# Patient Record
Sex: Female | Born: 1990 | Race: Black or African American | Hispanic: No | Marital: Single | Smoking: Never smoker
Health system: Southern US, Community
[De-identification: ages and names within clinical notes are randomized; demographics above are authoritative.]

## PROBLEM LIST (undated history)

## (undated) ENCOUNTER — Inpatient Hospital Stay (HOSPITAL_COMMUNITY): Payer: BC Managed Care – PPO

## (undated) ENCOUNTER — Inpatient Hospital Stay (HOSPITAL_COMMUNITY): Payer: Self-pay

## (undated) DIAGNOSIS — D219 Benign neoplasm of connective and other soft tissue, unspecified: Secondary | ICD-10-CM

## (undated) DIAGNOSIS — D649 Anemia, unspecified: Secondary | ICD-10-CM

## (undated) DIAGNOSIS — R87629 Unspecified abnormal cytological findings in specimens from vagina: Secondary | ICD-10-CM

## (undated) HISTORY — DX: Anemia, unspecified: D64.9

## (undated) HISTORY — DX: Unspecified abnormal cytological findings in specimens from vagina: R87.629

## (undated) HISTORY — DX: Benign neoplasm of connective and other soft tissue, unspecified: D21.9

---

## 2011-03-30 ENCOUNTER — Emergency Department (HOSPITAL_COMMUNITY)
Admission: EM | Admit: 2011-03-30 | Discharge: 2011-03-31 | Disposition: A | Discharge status: Home / Self Care | Payer: Self-pay | Attending: Emergency Medicine | Admitting: Emergency Medicine

## 2011-03-30 DIAGNOSIS — R21 Rash and other nonspecific skin eruption: Secondary | ICD-10-CM | POA: Insufficient documentation

## 2011-03-30 DIAGNOSIS — L509 Urticaria, unspecified: Secondary | ICD-10-CM | POA: Insufficient documentation

## 2013-12-25 ENCOUNTER — Ambulatory Visit: Payer: Self-pay

## 2013-12-31 ENCOUNTER — Ambulatory Visit: Payer: Self-pay

## 2014-01-01 ENCOUNTER — Ambulatory Visit: Payer: Self-pay

## 2014-01-19 ENCOUNTER — Emergency Department (INDEPENDENT_AMBULATORY_CARE_PROVIDER_SITE_OTHER)
Admission: EM | Admit: 2014-01-19 | Discharge: 2014-01-19 | Disposition: A | Discharge status: Home / Self Care | Payer: 59 | Source: Home / Self Care | Attending: Family Medicine | Admitting: Family Medicine

## 2014-01-19 DIAGNOSIS — M549 Dorsalgia, unspecified: Secondary | ICD-10-CM

## 2014-01-19 DIAGNOSIS — M79609 Pain in unspecified limb: Secondary | ICD-10-CM

## 2014-01-19 DIAGNOSIS — Z041 Encounter for examination and observation following transport accident: Secondary | ICD-10-CM
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
## 2014-01-19 NOTE — ED Notes (Signed)
Waiting discharge papers 
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
## 2014-01-19 NOTE — Discharge Instructions (Signed)
Tylenol or advil and heat as needed, return as needed.

## 2015-09-21 ENCOUNTER — Encounter (HOSPITAL_COMMUNITY): Payer: Self-pay | Admitting: Emergency Medicine

## 2015-09-21 ENCOUNTER — Emergency Department (HOSPITAL_COMMUNITY)
Admission: EM | Admit: 2015-09-21 | Discharge: 2015-09-22 | Disposition: A | Discharge status: Home / Self Care | Payer: PRIVATE HEALTH INSURANCE | Attending: Emergency Medicine | Admitting: Emergency Medicine

## 2015-09-21 DIAGNOSIS — Z880 Allergy status to penicillin: Secondary | ICD-10-CM | POA: Diagnosis not present

## 2015-09-21 DIAGNOSIS — Y9389 Activity, other specified: Secondary | ICD-10-CM | POA: Insufficient documentation

## 2015-09-21 DIAGNOSIS — Y998 Other external cause status: Secondary | ICD-10-CM | POA: Insufficient documentation

## 2015-09-21 DIAGNOSIS — Y92410 Unspecified street and highway as the place of occurrence of the external cause: Secondary | ICD-10-CM | POA: Diagnosis not present

## 2015-09-21 DIAGNOSIS — S39012A Strain of muscle, fascia and tendon of lower back, initial encounter: Secondary | ICD-10-CM

## 2015-09-21 DIAGNOSIS — S3992XA Unspecified injury of lower back, initial encounter: Secondary | ICD-10-CM | POA: Diagnosis present

## 2015-09-21 MED ORDER — METHOCARBAMOL 500 MG PO TABS
500.0000 mg | ORAL_TABLET | Freq: Once | ORAL | Status: AC
  Administered 2015-09-21: 500 mg via ORAL
  Filled 2015-09-21: qty 1

## 2015-09-21 MED ORDER — METHOCARBAMOL 500 MG PO TABS: 500.0000 mg | ORAL_TABLET | Freq: Two times a day (BID) | ORAL | Status: DC

## 2015-09-21 MED ORDER — IBUPROFEN 400 MG PO TABS
400.0000 mg | ORAL_TABLET | Freq: Once | ORAL | Status: AC
  Administered 2015-09-21: 400 mg via ORAL
  Filled 2015-09-21: qty 1
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
## 2015-09-21 NOTE — Discharge Instructions (Signed)
For pain control you may take up to 800mg  of Motrin (also known as ibuprofen). That is usually 4 over the counter pills,  3 times a day. Take with food to minimize stomach irritation   For breakthrough pain you may take Robaxin. Do not drink alcohol, drive or operate heavy machinery when taking Robaxin.  Please follow with your primary care doctor in the next 2 days for a check-up. They must obtain records for further management.   Do not hesitate to return to the Emergency Department for any new, worsening or concerning symptoms.   Lumbosacral Strain Lumbosacral strain is a strain of any of the parts that make up your lumbosacral vertebrae. Your lumbosacral vertebrae are the bones that make up the lower third of your backbone. Your lumbosacral vertebrae are held together by muscles and tough, fibrous tissue (ligaments).  CAUSES  A sudden blow to your back can cause lumbosacral strain. Also, anything that causes an excessive stretch of the muscles in the low back can cause this strain. This is typically seen when people exert themselves strenuously, fall, lift heavy objects, bend, or crouch repeatedly. RISK FACTORS  Physically demanding work.  Participation in pushing or pulling sports or sports that require a sudden twist of the back (tennis, golf, baseball).  Weight lifting.  Excessive lower back curvature.  Forward-tilted pelvis.  Weak back or abdominal muscles or both.  Tight hamstrings. SIGNS AND SYMPTOMS  Lumbosacral strain may cause pain in the area of your injury or pain that moves (radiates) down your leg.  DIAGNOSIS Your health care provider can often diagnose lumbosacral strain through a physical exam. In some cases, you may need tests such as X-ray exams.  TREATMENT  Treatment for your lower back injury depends on many factors that your clinician will have to evaluate. However, most treatment will include the use of anti-inflammatory medicines. HOME CARE INSTRUCTIONS    Avoid hard physical activities (tennis, racquetball, waterskiing) if you are not in proper physical condition for it. This may aggravate or create problems.  If you have a back problem, avoid sports requiring sudden body movements. Swimming and walking are generally safer activities.  Maintain good posture.  Maintain a healthy weight.  For acute conditions, you may put ice on the injured area.  Put ice in a plastic bag.  Place a towel between your skin and the bag.  Leave the ice on for 20 minutes, 2-3 times a day.  When the low back starts healing, stretching and strengthening exercises may be recommended. SEEK MEDICAL CARE IF:  Your back pain is getting worse.  You experience severe back pain not relieved with medicines. SEEK IMMEDIATE MEDICAL CARE IF:   You have numbness, tingling, weakness, or problems with the use of your arms or legs.  There is a change in bowel or bladder control.  You have increasing pain in any area of the body, including your belly (abdomen).  You notice shortness of breath, dizziness, or feel faint.  You feel sick to your stomach (nauseous), are throwing up (vomiting), or become sweaty.  You notice discoloration of your toes or legs, or your feet get very cold. MAKE SURE YOU:   Understand these instructions.  Will watch your condition.  Will get help right away if you are not doing well or get worse.   This information is not intended to replace advice given to you by your health care provider. Make sure you discuss any questions you have with your health care provider.  Document Released: 09/13/2005 Document Revised: 12/25/2014 Document Reviewed: 07/23/2013 Elsevier Interactive Patient Education [employer].

## 2015-09-22 NOTE — ED Notes (Signed)
Pt waiting for registration to obtain insurance info and consent for treatment; will DC patient afterward; pt verbalizes need for wait

## 2015-10-27 ENCOUNTER — Other Ambulatory Visit: Payer: Self-pay | Admitting: Physician Assistant

## 2015-10-27 ENCOUNTER — Ambulatory Visit
Admission: RE | Admit: 2015-10-27 | Discharge: 2015-10-27 | Disposition: A | Discharge status: Home / Self Care | Payer: PRIVATE HEALTH INSURANCE | Source: Ambulatory Visit | Attending: Physician Assistant | Admitting: Physician Assistant

## 2015-10-29 ENCOUNTER — Ambulatory Visit
Admission: RE | Admit: 2015-10-29 | Discharge: 2015-10-29 | Disposition: A | Discharge status: Home / Self Care | Payer: PRIVATE HEALTH INSURANCE | Source: Ambulatory Visit | Attending: Physician Assistant | Admitting: Physician Assistant
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
## 2020-06-16 ENCOUNTER — Encounter: Payer: Self-pay | Admitting: General Practice

## 2020-06-16 ENCOUNTER — Ambulatory Visit (INDEPENDENT_AMBULATORY_CARE_PROVIDER_SITE_OTHER): Payer: BC Managed Care – PPO | Admitting: General Practice

## 2020-06-16 ENCOUNTER — Other Ambulatory Visit: Payer: Self-pay

## 2020-06-16 DIAGNOSIS — Z3201 Encounter for pregnancy test, result positive: Secondary | ICD-10-CM | POA: Diagnosis not present

## 2020-06-16 LAB — POCT PREGNANCY, URINE: Preg Test, Ur: POSITIVE — AB

## 2020-06-16 NOTE — Progress Notes (Signed)
Patient arrived to office and gave urine sample for UPT then left. UPT +. Attempted to call patient twice, no answer. Not able to leave voicemail as it was not set up. Will send letter.   Koren Bound RN BSN 06/16/20

## 2020-06-18 ENCOUNTER — Telehealth (INDEPENDENT_AMBULATORY_CARE_PROVIDER_SITE_OTHER): Payer: PRIVATE HEALTH INSURANCE | Admitting: Lactation Services

## 2020-06-18 DIAGNOSIS — Z3201 Encounter for pregnancy test, result positive: Secondary | ICD-10-CM

## 2020-06-18 NOTE — Telephone Encounter (Signed)
Patient called and was upset that she was not called with her pregnancy test results that were taken earlier in the week. Patient was upset that she had not been called.   Nurse did attempt to call patient x 2 and sent a letter.   Called patient and she reports her phone number was incorrect in the chart and that she has spoken with the front desk this morning and was given her results and told when she can get an appt for [facility]. Patient voiced she has no further questions or concerns at this time.

## 2020-06-18 NOTE — Progress Notes (Signed)
Patient was assessed and managed by nursing staff during this encounter. I have reviewed the chart and agree with the documentation and plan.  Maryann Conners, CNM 06/18/2020 8:17 PM

## 2020-06-27 ENCOUNTER — Other Ambulatory Visit: Payer: Self-pay

## 2020-06-27 ENCOUNTER — Ambulatory Visit (HOSPITAL_COMMUNITY)
Admission: EM | Admit: 2020-06-27 | Discharge: 2020-06-27 | Disposition: A | Discharge status: Home / Self Care | Payer: PRIVATE HEALTH INSURANCE

## 2020-06-27 ENCOUNTER — Encounter (HOSPITAL_COMMUNITY): Payer: Self-pay | Admitting: Obstetrics and Gynecology

## 2020-06-27 ENCOUNTER — Inpatient Hospital Stay (HOSPITAL_COMMUNITY): Payer: BC Managed Care – PPO

## 2020-06-27 ENCOUNTER — Inpatient Hospital Stay (HOSPITAL_COMMUNITY)
Admission: AD | Admit: 2020-06-27 | Discharge: 2020-06-27 | Disposition: A | Discharge status: Home / Self Care | Payer: BC Managed Care – PPO | Attending: Obstetrics and Gynecology | Admitting: Obstetrics and Gynecology

## 2020-06-27 DIAGNOSIS — D259 Leiomyoma of uterus, unspecified: Secondary | ICD-10-CM | POA: Diagnosis not present

## 2020-06-27 DIAGNOSIS — O468X1 Other antepartum hemorrhage, first trimester: Secondary | ICD-10-CM

## 2020-06-27 DIAGNOSIS — Z880 Allergy status to penicillin: Secondary | ICD-10-CM | POA: Diagnosis not present

## 2020-06-27 DIAGNOSIS — O209 Hemorrhage in early pregnancy, unspecified: Secondary | ICD-10-CM

## 2020-06-27 DIAGNOSIS — A5901 Trichomonal vulvovaginitis: Secondary | ICD-10-CM | POA: Diagnosis not present

## 2020-06-27 DIAGNOSIS — O3680X0 Pregnancy with inconclusive fetal viability, not applicable or unspecified: Secondary | ICD-10-CM

## 2020-06-27 DIAGNOSIS — O208 Other hemorrhage in early pregnancy: Secondary | ICD-10-CM | POA: Insufficient documentation

## 2020-06-27 DIAGNOSIS — O3411 Maternal care for benign tumor of corpus uteri, first trimester: Secondary | ICD-10-CM | POA: Insufficient documentation

## 2020-06-27 DIAGNOSIS — O98311 Other infections with a predominantly sexual mode of transmission complicating pregnancy, first trimester: Secondary | ICD-10-CM | POA: Diagnosis not present

## 2020-06-27 DIAGNOSIS — Z3A01 Less than 8 weeks gestation of pregnancy: Secondary | ICD-10-CM | POA: Diagnosis not present

## 2020-06-27 DIAGNOSIS — O418X10 Other specified disorders of amniotic fluid and membranes, first trimester, not applicable or unspecified: Secondary | ICD-10-CM

## 2020-06-27 DIAGNOSIS — O26891 Other specified pregnancy related conditions, first trimester: Secondary | ICD-10-CM

## 2020-06-27 LAB — URINALYSIS, ROUTINE W REFLEX MICROSCOPIC
Bacteria, UA: NONE SEEN
Bilirubin Urine: NEGATIVE
Glucose, UA: NEGATIVE mg/dL
Ketones, ur: NEGATIVE mg/dL
Leukocytes,Ua: NEGATIVE
Nitrite: NEGATIVE
Protein, ur: NEGATIVE mg/dL
Specific Gravity, Urine: 1.026 (ref 1.005–1.030)
pH: 5 (ref 5.0–8.0)

## 2020-06-27 LAB — CBC
HCT: 35.6 % — ABNORMAL LOW (ref 36.0–46.0)
Hemoglobin: 11.3 g/dL — ABNORMAL LOW (ref 12.0–15.0)
MCH: 26.3 pg (ref 26.0–34.0)
MCHC: 31.7 g/dL (ref 30.0–36.0)
MCV: 82.8 fL (ref 80.0–100.0)
Platelets: 338 10*3/uL (ref 150–400)
RBC: 4.3 MIL/uL (ref 3.87–5.11)
RDW: 14.3 % (ref 11.5–15.5)
WBC: 8.8 10*3/uL (ref 4.0–10.5)
nRBC: 0 % (ref 0.0–0.2)

## 2020-06-27 LAB — WET PREP, GENITAL
Sperm: NONE SEEN
Yeast Wet Prep HPF POC: NONE SEEN

## 2020-06-27 LAB — ABO/RH: ABO/RH(D): O POS

## 2020-06-27 LAB — HCG, QUANTITATIVE, PREGNANCY: hCG, Beta Chain, Quant, S: 4515 m[IU]/mL — ABNORMAL HIGH (ref <5)

## 2020-06-27 MED ORDER — METRONIDAZOLE 500 MG PO TABS
2000.0000 mg | ORAL_TABLET | Freq: Once | ORAL | Status: AC
  Administered 2020-06-27: 2000 mg via ORAL
  Filled 2020-06-27: qty 4

## 2020-06-27 NOTE — Discharge Instructions (Signed)
Return to care   If you have heavier bleeding that soaks through more that 2 pads per hour for an hour or more  If you bleed so much that you feel like you might pass out or you do pass out  If you have significant abdominal pain that is not improved with Tylenol     Uterine Fibroids  Uterine fibroids (leiomyomas) are noncancerous (benign) tumors that can develop in the uterus. Fibroids may also develop in the fallopian tubes, cervix, or tissues (ligaments) near the uterus. You may have one or many fibroids. Fibroids vary in size, weight, and where they grow in the uterus. Some can become quite large. Most fibroids do not require medical treatment. What are the causes? The cause of this condition is not known. What increases the risk? You are more likely to develop this condition if you:  Are in your 30s or 40s and have not gone through menopause.  Have a family history of this condition.  Are of African-American descent.  Had your first period at an early age (early menarche).  Have not had any children (nulliparity).  Are overweight or obese. What are the signs or symptoms? Many women do not have any symptoms. Symptoms of this condition may include:  Heavy menstrual bleeding.  Bleeding or spotting between periods.  Pain and pressure in the pelvic area, between the hips.  Bladder problems, such as needing to urinate urgently or more often than usual.  Inability to have children (infertility).  Failure to carry pregnancy to term (miscarriage). How is this diagnosed? This condition may be diagnosed based on:  Your symptoms and medical history.  A physical exam.  A pelvic exam that includes feeling for any tumors.  Imaging tests, such as ultrasound or MRI. How is this treated? Treatment for this condition may include:  Seeing your health care provider for follow-up visits to monitor your fibroids for any changes.  Taking NSAIDs such as ibuprofen, naproxen, or  aspirin to reduce pain.  Hormone medicines. These may be taken as a pill, given in an injection, or delivered by a T-shaped device that is inserted into the uterus (intrauterine device, IUD).  Surgery to remove one of the following: ? The fibroids (myomectomy). Your health care provider may recommend this if fibroids affect your fertility and you want to become pregnant. ? The uterus (hysterectomy). ? Blood supply to the fibroids (uterine artery embolization). Follow these instructions at home:  Take over-the-counter and prescription medicines only as told by your health care provider.  Ask your health care provider if you should take iron pills or eat more iron-rich foods, such as dark green, leafy vegetables. Heavy menstrual bleeding can cause low iron levels.  If directed, apply heat to your back or abdomen to reduce pain. Use the heat source that your health care provider recommends, such as a moist heat pack or a heating pad. ? Place a towel between your skin and the heat source. ? Leave the heat on for 20-30 minutes. ? Remove the heat if your skin turns bright red. This is especially important if you are unable to feel pain, heat, or cold. You may have a greater risk of getting burned.  Pay close attention to your menstrual cycle. Tell your health care provider about any changes, such as: ? Increased blood flow that requires you to use more pads or tampons than usual. ? A change in the number of days that your period lasts. ? A change in symptoms  that are associated with your period, such as back pain or cramps in your abdomen.  Keep all follow-up visits as told by your health care provider. This is important, especially if your fibroids need to be monitored for any changes. Contact a health care provider if you:  Have pelvic pain, back pain, or cramps in your abdomen that do not get better with medicine or heat.  Develop new bleeding between periods.  Have increased bleeding  during or between periods.  Feel unusually tired or weak.  Feel light-headed. Get help right away if you:  Faint.  Have pelvic pain that suddenly gets worse.  Have severe vaginal bleeding that soaks a tampon or pad in 30 minutes or less. Summary  Uterine fibroids are noncancerous (benign) tumors that can develop in the uterus.  The exact cause of this condition is not known.  Most fibroids do not require medical treatment unless they affect your ability to have children (fertility).  Contact a health care provider if you have pelvic pain, back pain, or cramps in your abdomen that do not get better with medicines.  Make sure you know what symptoms should cause you to get help right away. This information is not intended to replace advice given to you by your health care provider. Make sure you discuss any questions you have with your health care provider. Document Revised: 11/16/2017 Document Reviewed: 10/30/2017 Elsevier Patient Education  2020 [employer].  Threatened Miscarriage  A threatened miscarriage occurs when a woman has vaginal bleeding during the first 20 weeks of pregnancy but the pregnancy has not ended. If you have vaginal bleeding during this time, your health care provider will do tests to make sure you are still pregnant. If the tests show that you are still pregnant and that the developing baby (fetus) inside your uterus is still growing, your condition is considered a threatened miscarriage. A threatened miscarriage does not mean your pregnancy will end, but it does increase the risk of losing your pregnancy (complete miscarriage). What are the causes? The cause of this condition is usually not known. For women who go on to have a complete miscarriage, the most common cause is an abnormal number of chromosomes in the developing baby. Chromosomes are the structures inside cells that hold all of a person's genetic material. What increases the risk? The following  lifestyle factors may increase your risk of a miscarriage in early pregnancy:  Smoking.  Drinking excessive amounts of alcohol or caffeine.  Recreational drug use. The following preexisting health conditions may increase your risk of a miscarriage in early pregnancy:  Polycystic ovary syndrome.  Uterine fibroids.  Infections.  Diabetes mellitus. What are the signs or symptoms? Symptoms of this condition include:  Vaginal bleeding.  Mild abdominal pain or cramps. How is this diagnosed? If you have bleeding with or without abdominal pain before 20 weeks of pregnancy, your health care provider will do tests to check whether you are still pregnant. These will include:  Ultrasound. This test uses sound waves to create images of the inside of your uterus. This allows your health care provider to look at your developing baby and other structures, such as your placenta.  Pelvic exam. This is an internal exam of your vagina and cervix.  Measurement of your baby's heart rate.  Laboratory tests such as blood tests, urine tests, or swabs for infection You may be diagnosed with a threatened miscarriage if:  Ultrasound testing shows that you are still pregnant.  Your  baby's heart rate is strong.  A pelvic exam shows that the opening between your uterus and your vagina (cervix) is closed.  Blood tests confirm that you are still pregnant. How is this treated? No treatments have been shown to prevent a threatened miscarriage from going on to a complete miscarriage. However, the right home care is important. Follow these instructions at home:  Get plenty of rest.  Do not have sex or use tampons if you have vaginal bleeding.  Do not douche.  Do not smoke or use recreational drugs.  Do not drink alcohol.  Avoid caffeine.  Keep all follow-up prenatal visits as told by your health care provider. This is important. Contact a health care provider if:  You have light vaginal  bleeding or spotting while pregnant.  You have abdominal pain or cramping.  You have a fever. Get help right away if:  You have heavy vaginal bleeding.  You have blood clots coming from your vagina.  You pass tissue from your vagina.  You leak fluid, or you have a gush of fluid from your vagina.  You have severe low back pain or abdominal cramps.  You have fever, chills, and severe abdominal pain. Summary  A threatened miscarriage occurs when a woman has vaginal bleeding during the first 20 weeks of pregnancy but the pregnancy has not ended.  The cause of a threatened miscarriage is usually not known.  Symptoms of this condition may include vaginal bleeding and mild abdominal pain or cramps.  No treatments have been shown to prevent a threatened miscarriage from going on to a complete miscarriage.  Keep all follow-up prenatal visits as told by your health care provider. This is important. This information is not intended to replace advice given to you by your health care provider. Make sure you discuss any questions you have with your health care provider. Document Revised: 01/10/2018 Document Reviewed: 03/02/2017 Elsevier Patient Education  [employer].  Trichomoniasis Trichomoniasis is an STI (sexually transmitted infection) that can affect both women and men. In women, the outer area of the female genitalia (vulva) and the vagina are affected. In men, mainly the penis is affected, but the prostate and other reproductive organs can also be involved.  This condition can be treated with medicine. It often has no symptoms (is asymptomatic), especially in men. If not treated, trichomoniasis can last for months or years. What are the causes? This condition is caused by a parasite called Trichomonas vaginalis. Trichomoniasis most often spreads from person to person (is contagious) through sexual contact. What increases the risk? The following factors may make you more likely  to develop this condition:  Having unprotected sex.  Having sex with a partner who has trichomoniasis.  Having multiple sexual partners.  Having had previous trichomoniasis infections or other STIs. What are the signs or symptoms? In women, symptoms of trichomoniasis include:  Abnormal vaginal discharge that is clear, white, gray, or yellow-green and foamy and has an unusual "fishy" odor.  Itching and irritation of the vagina and vulva.  Burning or pain during urination or sex.  Redness and swelling of the genitals. In men, symptoms of trichomoniasis include:  Penile discharge that may be foamy or contain pus.  Pain in the penis. This may happen only when urinating.  Itching or irritation inside the penis.  Burning after urination or ejaculation. How is this diagnosed? In women, this condition may be found during a routine Pap test or physical exam. It may be found  in men during a routine physical exam. Your health care provider may do tests to help diagnose this infection, such as:  Urine tests (men and women).  The following in women: ? Testing the pH of the vagina. ? A vaginal swab test that checks for the Trichomonas vaginalis parasite. ? Testing vaginal secretions. Your health care provider may test you for other STIs, including HIV (human immunodeficiency virus). How is this treated? This condition is treated with medicine taken by mouth (orally), such as metronidazole or tinidazole, to fight the infection. Your sexual partner(s) also need to be tested and treated.  If you are a woman and you plan to become pregnant or think you may be pregnant, tell your health care provider right away. Some medicines that are used to treat the infection should not be taken during pregnancy. Your health care provider may recommend over-the-counter medicines or creams to help relieve itching or irritation. You may be tested for infection again 3 months after treatment. Follow these  instructions at home:  Take and use over-the-counter and prescription medicines, including creams, only as told by your health care provider.  Take your antibiotic medicine as told by your health care provider. Do not stop taking the antibiotic even if you start to feel better.  Do not have sex until 7-10 days after you finish your medicine, or until your health care provider approves. Ask your health care provider when you may start to have sex again.  (Women) Do not douche or wear tampons while you have the infection.  Discuss your infection with your sexual partner(s). Make sure that your partner gets tested and treated, if necessary.  Keep all follow-up visits as told by your health care provider. This is important. How is this prevented?   Use condoms every time you have sex. Using condoms correctly and consistently can help protect against STIs.  Avoid having multiple sexual partners.  Talk with your sexual partner about any symptoms that either of you may have, as well as any history of STIs.  Get tested for STIs and STDs (sexually transmitted diseases) before you have sex. Ask your partner to do the same.  Do not have sexual contact if you have symptoms of trichomoniasis or another STI. Contact a health care provider if:  You still have symptoms after you finish your medicine.  You develop pain in your abdomen.  You have pain when you urinate.  You have bleeding after sex.  You develop a rash.  You feel nauseous or you vomit.  You plan to become pregnant or think you may be pregnant. Summary  Trichomoniasis is an STI (sexually transmitted infection) that can affect both women and men.  This condition often has no symptoms (is asymptomatic), especially in men.  Without treatment, this condition can last for months or years.  You should not have sex until 7-10 days after you finish your medicine, or until your health care provider approves. Ask your health care  provider when you may start to have sex again.  Discuss your infection with your sexual partner(s). Make sure that your partner gets tested and treated, if necessary. This information is not intended to replace advice given to you by your health care provider. Make sure you discuss any questions you have with your health care provider. Document Revised: 09/17/2018 Document Reviewed: 09/17/2018 Elsevier Patient Education  [employer].

## 2020-06-27 NOTE — MAU Provider Note (Signed)
Chief Complaint: Abdominal Pain and Vaginal Bleeding   First Provider Initiated Contact with Patient 06/27/20 1610     SUBJECTIVE HPI: Anne Anderson is a 29 y.o. G1P0 at [redacted]w[redacted]d who presents to [facility] reporting abdominal cramping and vaginal bleeding.  Had vaginal spotting that started last Tuesday after intercourse.  Bleeding increased and bright red although not saturating pads.  Passed a few small clots earlier today. Some lower abdominal cramping this weekend.   Location: abdomen Quality: cramping Severity: 3/10 on pain scale Duration: 2 days Timing: intermittent Modifying factors: none Associated signs and symptoms: vaginal bleeding  History reviewed. No pertinent past medical history. OB History  Gravida Para Term Preterm AB Living  1            SAB TAB Ectopic Multiple Live Births               # Outcome Date GA Lbr Len/2nd Weight Sex Delivery Anes PTL Lv  1 Current            History reviewed. No pertinent surgical history. Social History   Socioeconomic History  . Marital status: Single    Spouse name: Not on file  . Number of children: Not on file  . Years of education: Not on file  . Highest education level: Not on file  Occupational History  . Not on file  Tobacco Use  . Smoking status: Never Smoker  . Smokeless tobacco: Never Used  Substance and Sexual Activity  . Alcohol use: Yes  . Drug use: Not Currently  . Sexual activity: Yes  Other Topics Concern  . Not on file  Social History Narrative  . Not on file   Social Determinants of Health   Financial Resource Strain:   . Difficulty of Paying Living Expenses:   Food Insecurity:   . Worried About Charity fundraiser in the Last Year:   . Arboriculturist in the Last Year:   Transportation Needs:   . Film/video editor (Medical):   Marland Kitchen Lack of Transportation (Non-Medical):   Physical Activity:   . Days of Exercise per Week:   . Minutes of Exercise per Session:   Stress:   . Feeling  of Stress :   Social Connections:   . Frequency of Communication with Friends and Family:   . Frequency of Social Gatherings with Friends and Family:   . Attends Religious Services:   . Active Member of Clubs or Organizations:   . Attends Archivist Meetings:   Marland Kitchen Marital Status:   Intimate Partner Violence:   . Fear of Current or Ex-Partner:   . Emotionally Abused:   Marland Kitchen Physically Abused:   . Sexually Abused:    Family History  Problem Relation Age of Onset  . Hyperlipidemia Mother   . Cancer Maternal Grandmother   . Diabetes Maternal Grandfather    No current facility-administered medications on file prior to encounter.   No current outpatient medications on file prior to encounter.   Allergies  Allergen Reactions  . Penicillins     I have reviewed patient's Past Medical Hx, Surgical Hx, Family Hx, Social Hx, medications and allergies.   Review of Systems  Constitutional: Negative.   Gastrointestinal: Positive for abdominal pain. Negative for diarrhea, nausea and vomiting.  Genitourinary: Positive for vaginal bleeding.    OBJECTIVE Patient Vitals for the past 24 hrs:  BP Temp Temp src Pulse Resp SpO2 Height Weight  06/27/20 1826 127/74 -- -- Marland Kitchen  104 16 100 % -- --  06/27/20 1527 121/67 99.6 F (37.6 C) Oral (!) 109 16 100 % -- --  06/27/20 1521 -- -- -- -- -- -- 5\' 2"  (1.575 m) 93.9 kg   Constitutional: Well-developed, well-nourished female in no acute distress.  Cardiovascular: normal rate & rhythm, no murmur Respiratory: normal rate and effort. Lung sounds clear throughout GI: Abd soft, non-tender, Pos BS x 4. No guarding or rebound tenderness MS: Extremities nontender, no edema, normal ROM Neurologic: Alert and oriented x 4.  GU:     SPECULUM EXAM: NEFG, small amount of dark red blood coming from os  BIMANUAL: No CMT. cervix closed; uterus enlarged, no adnexal tenderness or masses.    LAB RESULTS Results for orders placed or performed during the  hospital encounter of 06/27/20 (from the past 24 hour(s))  Urinalysis, Routine w reflex microscopic     Status: Abnormal   Collection Time: 06/27/20  3:49 PM  Result Value Ref Range   Color, Urine YELLOW YELLOW   APPearance CLEAR CLEAR   Specific Gravity, Urine 1.026 1.005 - 1.030   pH 5.0 5.0 - 8.0   Glucose, UA NEGATIVE NEGATIVE mg/dL   Hgb urine dipstick LARGE (A) NEGATIVE   Bilirubin Urine NEGATIVE NEGATIVE   Ketones, ur NEGATIVE NEGATIVE mg/dL   Protein, ur NEGATIVE NEGATIVE mg/dL   Nitrite NEGATIVE NEGATIVE   Leukocytes,Ua NEGATIVE NEGATIVE   RBC / HPF 0-5 0 - 5 RBC/hpf   WBC, UA 0-5 0 - 5 WBC/hpf   Bacteria, UA NONE SEEN NONE SEEN   Squamous Epithelial / LPF 0-5 0 - 5   Mucus PRESENT    Trichomonas, UA PRESENT (A) NONE SEEN  CBC     Status: Abnormal   Collection Time: 06/27/20  4:02 PM  Result Value Ref Range   WBC 8.8 4.0 - 10.5 K/uL   RBC 4.30 3.87 - 5.11 MIL/uL   Hemoglobin 11.3 (L) 12.0 - 15.0 g/dL   HCT 35.6 (L) 36 - 46 %   MCV 82.8 80.0 - 100.0 fL   MCH 26.3 26.0 - 34.0 pg   MCHC 31.7 30.0 - 36.0 g/dL   RDW 14.3 11.5 - 15.5 %   Platelets 338 150 - 400 K/uL   nRBC 0.0 0.0 - 0.2 %  ABO/Rh     Status: None   Collection Time: 06/27/20  4:02 PM  Result Value Ref Range   ABO/RH(D) O POS    No rh immune globuloin      NOT A RH IMMUNE GLOBULIN CANDIDATE, PT RH POSITIVE Performed at [facility], [address] [place], [place] [address]   hCG, quantitative, pregnancy     Status: Abnormal   Collection Time: 06/27/20  4:02 PM  Result Value Ref Range   hCG, Beta Chain, Quant, S 4,515 (H) <5 mIU/mL  Wet prep, genital     Status: Abnormal   Collection Time: 06/27/20  4:28 PM  Result Value Ref Range   Yeast Wet Prep HPF POC NONE SEEN NONE SEEN   Trich, Wet Prep PRESENT (A) NONE SEEN   Clue Cells Wet Prep HPF POC PRESENT (A) NONE SEEN   WBC, Wet Prep HPF POC MANY (A) NONE SEEN   Sperm NONE SEEN     IMAGING US OB LESS THAN 14 WEEKS WITH OB  TRANSVAGINAL  Result Date: 06/27/2020 CLINICAL DATA:  Vaginal bleeding, pain, pregnant EXAM: OBSTETRIC <14 WK [place] AND TRANSVAGINAL OB US TECHNIQUE: Both transabdominal and transvaginal  ultrasound examinations were performed for complete evaluation of the gestation as well as the maternal uterus, adnexal regions, and pelvic cul-de-sac. Transvaginal technique was performed to assess early pregnancy. COMPARISON:  None. FINDINGS: Intrauterine gestational sac: Single Yolk sac:  Visualized. Embryo:  Visualized. Cardiac Activity: Not Visualized. CRL:  6.5 mm   6 w   3 d                  [place] EDC: 02/19/2021 Subchorionic hemorrhage: Small subchorionic hemorrhage along the fundal aspect of the gestational sac. Maternal uterus/adnexae: Multiple intrauterine fibroids are seen, largest measuring 10.3 cm in the uterine fundus. Right ovary measures 3.7 x 1.9 x 2.4 cm and the left ovary measures 2.3 x 2.6 x 2.1 cm. Likely corpus luteum cyst within the left ovary measuring up to 1.9 cm. There is trace free fluid in the pelvis.  Cervix is closed. IMPRESSION: 1. Single intrauterine pregnancy as above, estimated age 35 weeks and 3 days based on crown rump length measurement. Cardiac activity is not visualized. Findings are suspicious but not yet definitive for failed pregnancy. Recommend follow-up US in 10-14 days for definitive diagnosis. This recommendation follows SRU consensus guidelines: Diagnostic Criteria for Nonviable Pregnancy Early in the First Trimester. [place] [employer] Med 2013; 829:9371-69. 2. Trace pelvic free fluid. 3. Multiple uterine fibroids. Electronically Signed   By: Randa Ngo M.D.   On: 06/27/2020 17:29    MAU COURSE Orders Placed This Encounter  Procedures  . Wet prep, genital  . US OB LESS THAN 14 WEEKS WITH OB TRANSVAGINAL  . US OB Transvaginal  . CBC  . hCG, quantitative, pregnancy  . Urinalysis, Routine w reflex microscopic  . ABO/Rh  . Discharge patient   Meds ordered this encounter  Medications   . metroNIDAZOLE (FLAGYL) tablet 2,000 mg    MDM +UPT UA, wet prep, GC/chlamydia, CBC, ABO/Rh, quant hCG, and [place] today to rule out ectopic pregnancy which can be life threatening.   RH positive  Wet prep positive for trichomonas. Flagyl 2 gm po given in MAU. Discussed results with significant other as well. He was given expedited partner treatment prescription & info sheet.  GC/CT pending.   Ultrasound shows IUP with CRL of 6.5 mm. No cardiac activity. Not definitive for failed pregnancy, will need f/u ultrasound for viability. Also shows small subchorionic hemorrhage & multiple uterine fibroids.  ASSESSMENT 1. Pregnancy with uncertain fetal viability, single or unspecified fetus   2. Vaginal bleeding in pregnancy, first trimester   3. Abdominal pain during pregnancy in first trimester   4. Trichomonal vaginitis during pregnancy in first trimester   5. Subchorionic hematoma in first trimester, single or unspecified fetus   6. Uterine fibroids affecting pregnancy in first trimester     PLAN Discharge home in stable condition. F/u viability ultrasound ordered in 1 week GC/CT pending No intercourse x 2 weeks Discussed reasons to return to MAU    Follow-up Information    [facility].   Specialty: Radiology Why: Call [phone] to schedule your ultrasound Contact information: [address] [place] [address] [phone]             Allergies as of 06/27/2020      Reactions   Penicillins       Medication List    You have not been prescribed any medications.      Jorje Guild, NP 06/27/2020  7:02 PM

## 2020-06-27 NOTE — MAU Note (Signed)
Anne Anderson is a 29 y.o. at [redacted]w[redacted]d here in MAU reporting: Tuesday of last week started spotting during sex. States spotting stopped but then saw some more on Friday and then yesterday saw some spotting and had some heavier bleeding. Saw a few clots yesterday. Today bleeding is lighter but is seeing a small amount on a pad and then also when she is wiping. Having some cramping.  LMP: 05/03/20  Onset of complaint: ongoing  Pain score: 3/10  Vitals:   06/27/20 1527  BP: 121/67  Pulse: (!) 109  Resp: 16  Temp: 99.6 F (37.6 C)  SpO2: 100%     Lab orders placed from triage: UA

## 2020-06-27 NOTE — ED Notes (Signed)
Eval pt prior to registration. Pt reports vag bleeding onset yesterday, worse today with clots. Advised pt b/c she is pregnant she needs to go to [facility]U for higher level eval/tx per Rondel Oh, NP.

## 2020-06-28 LAB — GC/CHLAMYDIA PROBE AMP (~~LOC~~) NOT AT ARMC
Chlamydia: NEGATIVE
Comment: NEGATIVE
Comment: NORMAL
Neisseria Gonorrhea: NEGATIVE

## 2020-06-29 DIAGNOSIS — N911 Secondary amenorrhea: Secondary | ICD-10-CM | POA: Diagnosis not present

## 2020-06-30 ENCOUNTER — Ambulatory Visit: Payer: PRIVATE HEALTH INSURANCE

## 2020-08-24 DIAGNOSIS — D649 Anemia, unspecified: Secondary | ICD-10-CM | POA: Diagnosis not present

## 2020-08-24 DIAGNOSIS — D259 Leiomyoma of uterus, unspecified: Secondary | ICD-10-CM | POA: Diagnosis not present

## 2020-08-24 DIAGNOSIS — N926 Irregular menstruation, unspecified: Secondary | ICD-10-CM | POA: Diagnosis not present

## 2020-08-24 DIAGNOSIS — Z1329 Encounter for screening for other suspected endocrine disorder: Secondary | ICD-10-CM | POA: Diagnosis not present

## 2020-08-24 DIAGNOSIS — Z6841 Body Mass Index (BMI) 40.0 and over, adult: Secondary | ICD-10-CM | POA: Diagnosis not present

## 2020-08-24 DIAGNOSIS — Z131 Encounter for screening for diabetes mellitus: Secondary | ICD-10-CM | POA: Diagnosis not present

## 2020-08-24 DIAGNOSIS — Z113 Encounter for screening for infections with a predominantly sexual mode of transmission: Secondary | ICD-10-CM | POA: Diagnosis not present

## 2020-08-24 DIAGNOSIS — Z01419 Encounter for gynecological examination (general) (routine) without abnormal findings: Secondary | ICD-10-CM | POA: Diagnosis not present

## 2020-08-24 DIAGNOSIS — Z13220 Encounter for screening for lipoid disorders: Secondary | ICD-10-CM | POA: Diagnosis not present

## 2021-10-11 DIAGNOSIS — Z01419 Encounter for gynecological examination (general) (routine) without abnormal findings: Secondary | ICD-10-CM | POA: Diagnosis not present

## 2021-10-11 DIAGNOSIS — Z319 Encounter for procreative management, unspecified: Secondary | ICD-10-CM | POA: Diagnosis not present

## 2021-10-11 DIAGNOSIS — D259 Leiomyoma of uterus, unspecified: Secondary | ICD-10-CM | POA: Diagnosis not present

## 2021-10-11 DIAGNOSIS — D649 Anemia, unspecified: Secondary | ICD-10-CM | POA: Diagnosis not present

## 2021-10-11 DIAGNOSIS — Z113 Encounter for screening for infections with a predominantly sexual mode of transmission: Secondary | ICD-10-CM | POA: Diagnosis not present

## 2021-10-11 DIAGNOSIS — Z6841 Body Mass Index (BMI) 40.0 and over, adult: Secondary | ICD-10-CM | POA: Diagnosis not present

## 2021-10-27 IMAGING — US US OB < 14 WEEKS - US OB TV
1 series · 15 of 28 positions shown · non-contrast
Comparison: None.

CLINICAL DATA: Vaginal bleeding, pain, pregnant

EXAM:
OBSTETRIC <14 WK US AND TRANSVAGINAL OB US
TECHNIQUE: Both transabdominal and transvaginal ultrasound examinations were
performed for complete evaluation of the gestation as well as the
maternal uterus, adnexal regions, and pelvic cul-de-sac.
Transvaginal technique was performed to assess early pregnancy.

[Series 1: us ob < 14 weeks - us ob tv · 15 of 71 slices shown]
[im 1/71]
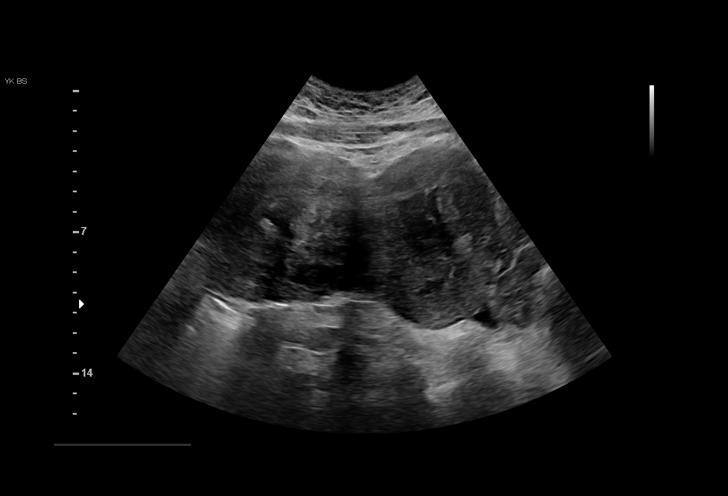
[im 6/71]
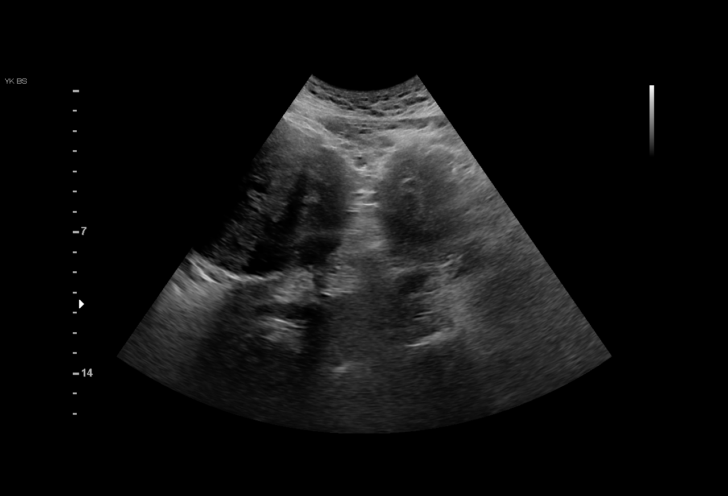
[im 11/71]
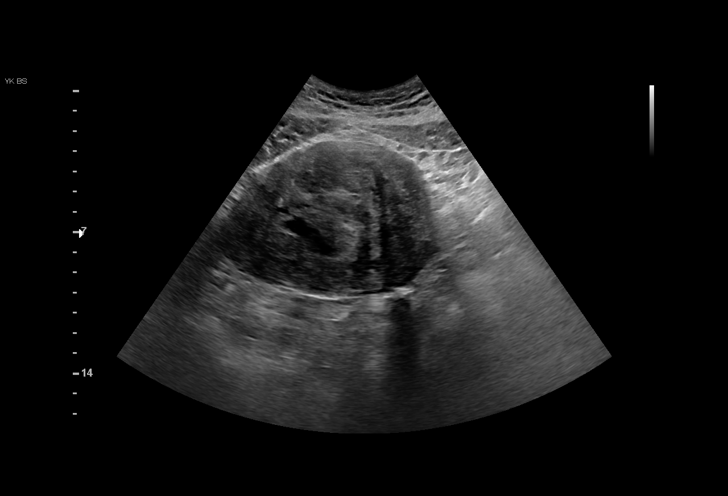
[im 16/71]
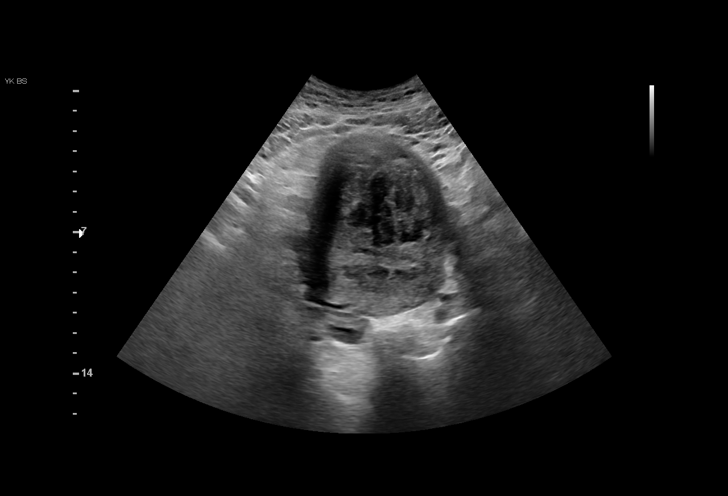
[im 21/71]
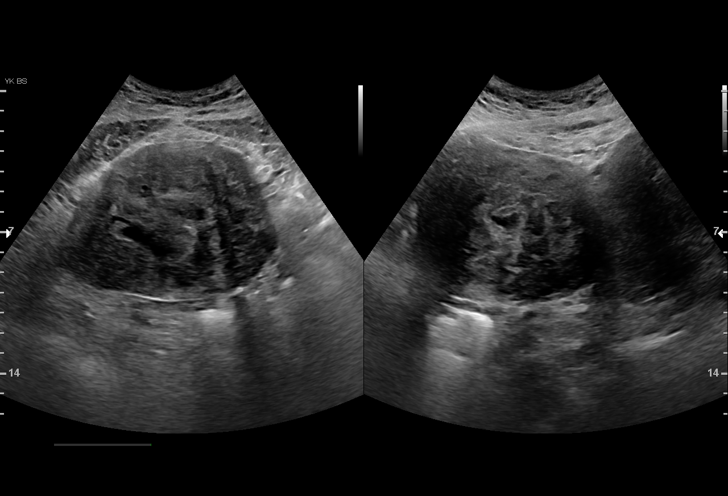
[im 26/71]
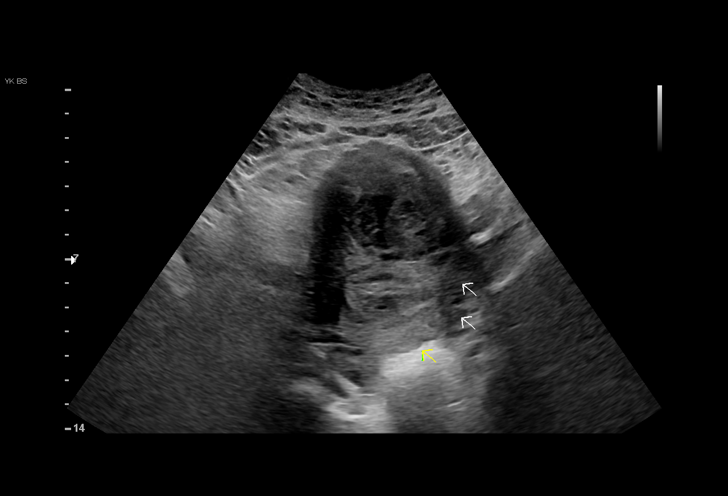
[im 32/71]
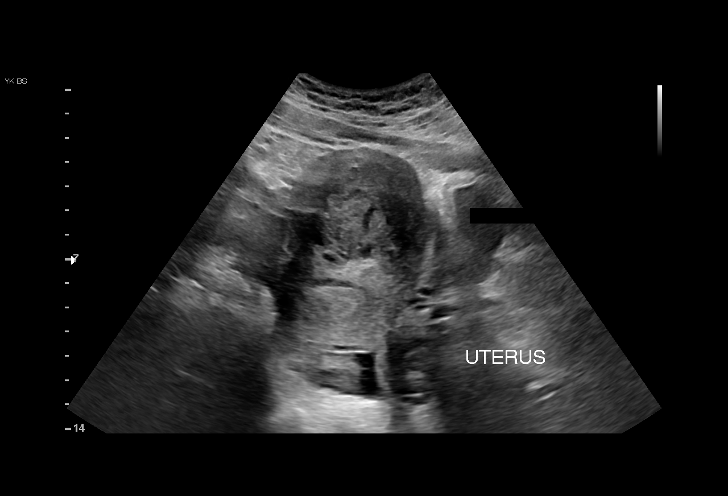
[im 37/71]
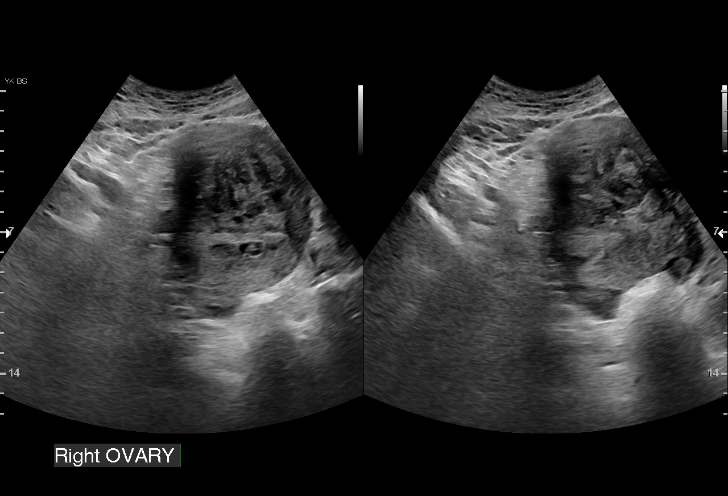
[im 39/71]
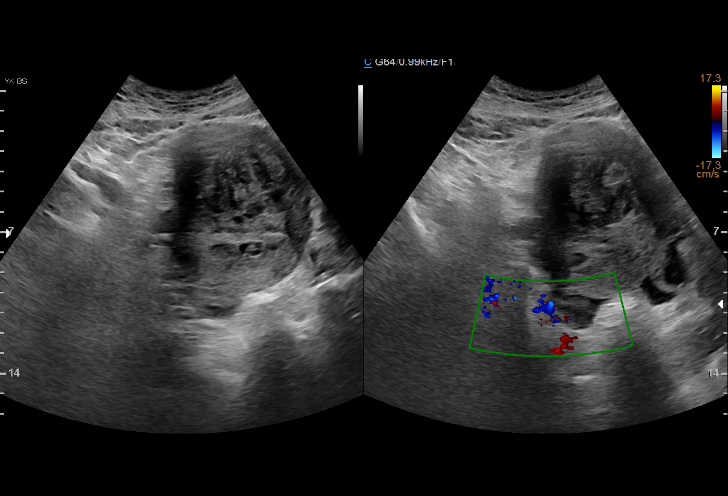
[im 45/71]
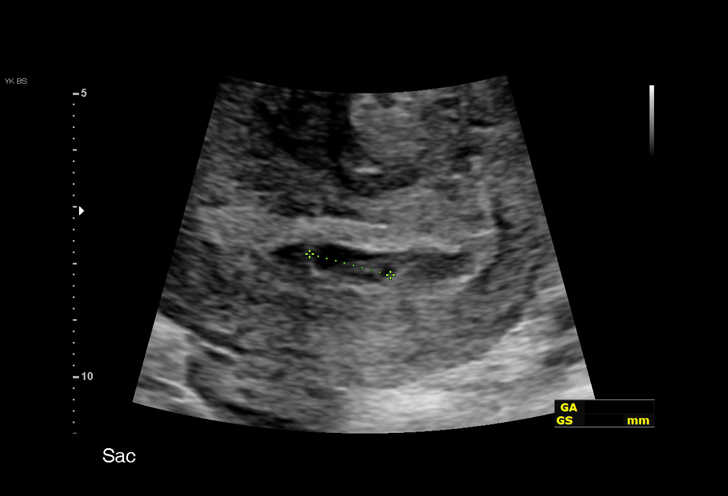
[im 50/71]
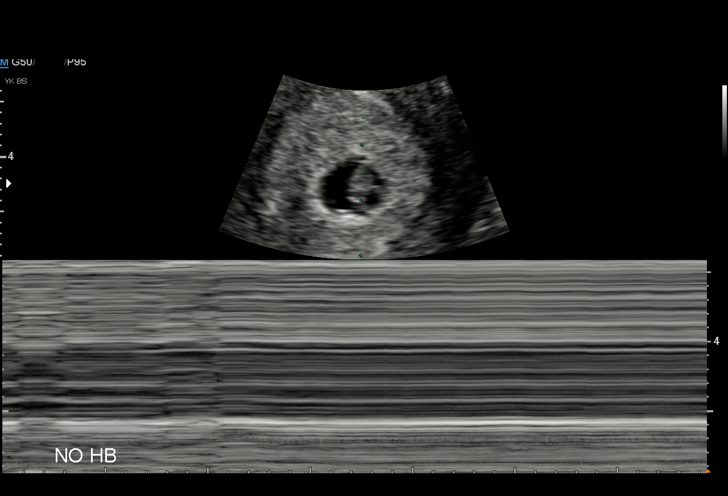
[im 55/71]
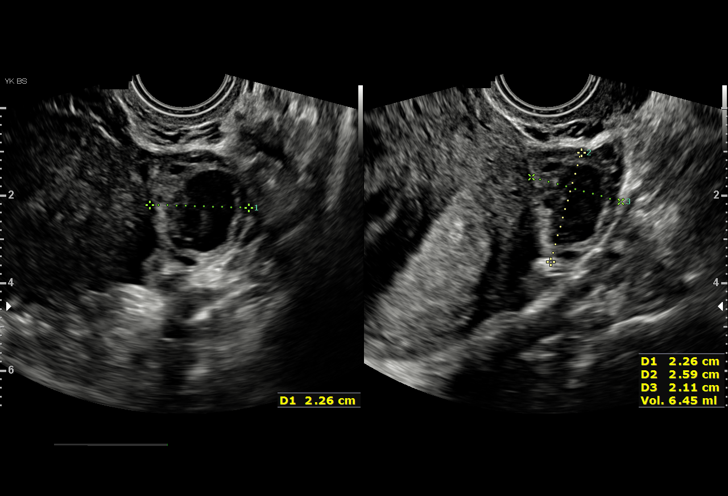
[im 60/71]
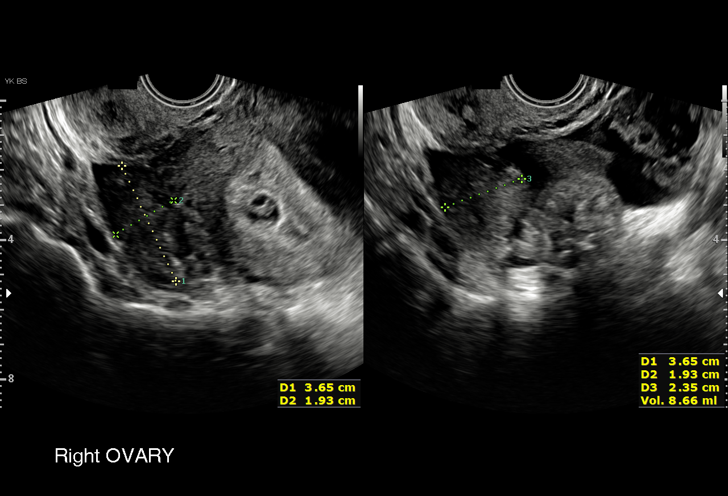
[im 65/71]
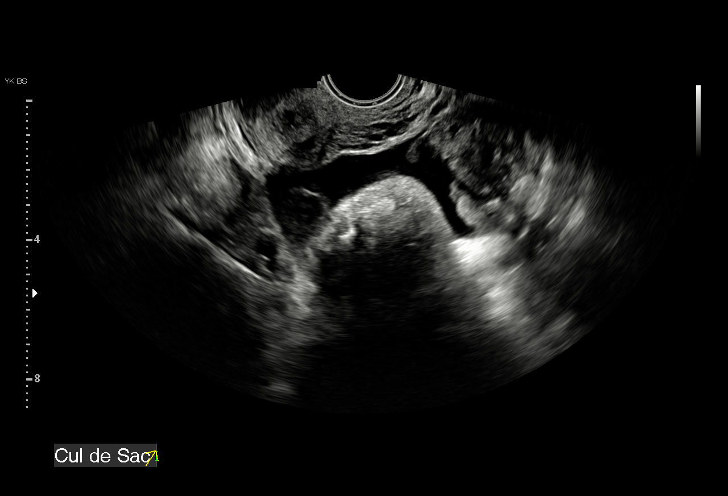
[im 71/71]
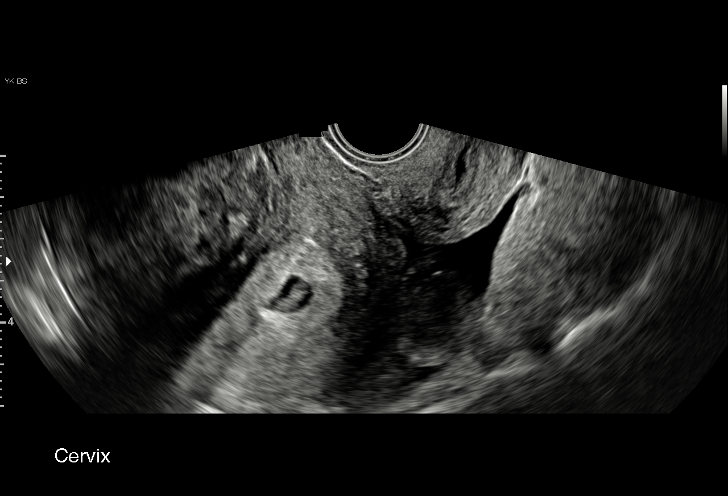

[15 of 28 positions shown; findings below may reference images not displayed]

FINDINGS: Intrauterine gestational sac: Single

Yolk sac:  Visualized.

Embryo:  Visualized.

Cardiac Activity: Not Visualized.

CRL:  6.5 mm   6 w   3 d                  US EDC: 02/19/2021

Subchorionic hemorrhage: Small subchorionic hemorrhage along the
fundal aspect of the gestational sac.

Maternal uterus/adnexae: Multiple intrauterine fibroids are seen,
largest measuring 10.3 cm in the uterine fundus.

Right ovary measures 3.7 x 1.9 x 2.4 cm and the left ovary measures
2.3 x 2.6 x 2.1 cm. Likely corpus luteum cyst within the left ovary
measuring up to 1.9 cm.

There is trace free fluid in the pelvis.  Cervix is closed.
IMPRESSION: 1. Single intrauterine pregnancy as above, estimated age 6 weeks and
3 days based on crown rump length measurement. Cardiac activity is
not visualized. Findings are suspicious but not yet definitive for
failed pregnancy. Recommend follow-up US in 10-14 days for
definitive diagnosis. This recommendation follows SRU consensus
guidelines: Diagnostic Criteria for Nonviable Pregnancy Early in the
First Trimester. N Engl J Med 3716; [DATE].
2. Trace pelvic free fluid.
3. Multiple uterine fibroids.

## 2022-03-15 DIAGNOSIS — F4323 Adjustment disorder with mixed anxiety and depressed mood: Secondary | ICD-10-CM | POA: Diagnosis not present

## 2022-03-24 DIAGNOSIS — F4323 Adjustment disorder with mixed anxiety and depressed mood: Secondary | ICD-10-CM | POA: Diagnosis not present

## 2022-04-11 DIAGNOSIS — Z111 Encounter for screening for respiratory tuberculosis: Secondary | ICD-10-CM | POA: Diagnosis not present

## 2022-04-18 DIAGNOSIS — F4323 Adjustment disorder with mixed anxiety and depressed mood: Secondary | ICD-10-CM | POA: Diagnosis not present

## 2022-05-02 DIAGNOSIS — F4323 Adjustment disorder with mixed anxiety and depressed mood: Secondary | ICD-10-CM | POA: Diagnosis not present

## 2022-05-04 DIAGNOSIS — R7301 Impaired fasting glucose: Secondary | ICD-10-CM | POA: Diagnosis not present

## 2022-05-04 DIAGNOSIS — D509 Iron deficiency anemia, unspecified: Secondary | ICD-10-CM | POA: Diagnosis not present

## 2022-05-04 DIAGNOSIS — Z0000 Encounter for general adult medical examination without abnormal findings: Secondary | ICD-10-CM | POA: Diagnosis not present

## 2022-05-16 DIAGNOSIS — F4323 Adjustment disorder with mixed anxiety and depressed mood: Secondary | ICD-10-CM | POA: Diagnosis not present

## 2022-06-14 DIAGNOSIS — F4323 Adjustment disorder with mixed anxiety and depressed mood: Secondary | ICD-10-CM | POA: Diagnosis not present

## 2022-06-29 DIAGNOSIS — F411 Generalized anxiety disorder: Secondary | ICD-10-CM | POA: Diagnosis not present

## 2022-07-12 DIAGNOSIS — F4323 Adjustment disorder with mixed anxiety and depressed mood: Secondary | ICD-10-CM | POA: Diagnosis not present

## 2022-07-25 DIAGNOSIS — F4323 Adjustment disorder with mixed anxiety and depressed mood: Secondary | ICD-10-CM | POA: Diagnosis not present

## 2022-08-08 DIAGNOSIS — F4323 Adjustment disorder with mixed anxiety and depressed mood: Secondary | ICD-10-CM | POA: Diagnosis not present

## 2022-08-16 DIAGNOSIS — Z113 Encounter for screening for infections with a predominantly sexual mode of transmission: Secondary | ICD-10-CM | POA: Diagnosis not present

## 2022-08-16 DIAGNOSIS — Z202 Contact with and (suspected) exposure to infections with a predominantly sexual mode of transmission: Secondary | ICD-10-CM | POA: Diagnosis not present

## 2022-08-16 DIAGNOSIS — N760 Acute vaginitis: Secondary | ICD-10-CM | POA: Diagnosis not present

## 2022-08-16 DIAGNOSIS — Z114 Encounter for screening for human immunodeficiency virus [HIV]: Secondary | ICD-10-CM | POA: Diagnosis not present

## 2022-08-22 DIAGNOSIS — F411 Generalized anxiety disorder: Secondary | ICD-10-CM | POA: Diagnosis not present

## 2022-08-22 DIAGNOSIS — F4323 Adjustment disorder with mixed anxiety and depressed mood: Secondary | ICD-10-CM | POA: Diagnosis not present

## 2022-09-19 DIAGNOSIS — F4323 Adjustment disorder with mixed anxiety and depressed mood: Secondary | ICD-10-CM | POA: Diagnosis not present

## 2022-10-20 DIAGNOSIS — Z113 Encounter for screening for infections with a predominantly sexual mode of transmission: Secondary | ICD-10-CM | POA: Diagnosis not present

## 2022-10-20 DIAGNOSIS — N760 Acute vaginitis: Secondary | ICD-10-CM | POA: Diagnosis not present

## 2022-10-20 DIAGNOSIS — Z6841 Body Mass Index (BMI) 40.0 and over, adult: Secondary | ICD-10-CM | POA: Diagnosis not present

## 2022-10-20 DIAGNOSIS — Z01419 Encounter for gynecological examination (general) (routine) without abnormal findings: Secondary | ICD-10-CM | POA: Diagnosis not present

## 2022-10-24 DIAGNOSIS — F4323 Adjustment disorder with mixed anxiety and depressed mood: Secondary | ICD-10-CM | POA: Diagnosis not present

## 2022-11-30 DIAGNOSIS — F4323 Adjustment disorder with mixed anxiety and depressed mood: Secondary | ICD-10-CM | POA: Diagnosis not present

## 2022-12-18 NOTE — L&D Delivery Note (Signed)
Delivery Note At 8:09 PM a viable female was delivered via Vaginal, Spontaneous.  Presentation: LOA.  Delivery of the head was followed by impacted anterior shoulder. Shoulder dystocia acknowledged First maneuver was McRoberts, followed by suprapubic pressure, followed by delivery of the second arm.  SD lasted just over 30 seconds, less than 1 min.  APGAR: pend ; weight pend .   Placenta status: intact, marginal cord insertion.   Cord: Normal.  Cord pH: arterial cord gas collected  Anesthesia:   Episiotomy: Median, very small to aid in delivery due to bradycardia.  ~5 mm incision made with mayo scissors at the midline. Lacerations: 2nd degree Suture Repair: 2.0 vicryl rapide Est. Blood Loss (mL):  411 cc.  TXA given prophylactic for large uterine fibroids.    The uterus was explored, noting significantly distorted cavity due to fibroids.  Additional membrane was removed manually.   Mom to postpartum.  Baby to Couplet care / Skin to Skin.  [name] 11/08/2023, 8:43 PM

## 2023-01-02 DIAGNOSIS — F4323 Adjustment disorder with mixed anxiety and depressed mood: Secondary | ICD-10-CM | POA: Diagnosis not present

## 2023-01-17 DIAGNOSIS — F4323 Adjustment disorder with mixed anxiety and depressed mood: Secondary | ICD-10-CM | POA: Diagnosis not present

## 2023-01-30 DIAGNOSIS — F4323 Adjustment disorder with mixed anxiety and depressed mood: Secondary | ICD-10-CM | POA: Diagnosis not present

## 2023-04-05 DIAGNOSIS — N911 Secondary amenorrhea: Secondary | ICD-10-CM | POA: Diagnosis not present

## 2023-04-10 DIAGNOSIS — Z3685 Encounter for antenatal screening for Streptococcus B: Secondary | ICD-10-CM | POA: Diagnosis not present

## 2023-04-10 DIAGNOSIS — Z3481 Encounter for supervision of other normal pregnancy, first trimester: Secondary | ICD-10-CM | POA: Diagnosis not present

## 2023-04-10 DIAGNOSIS — Z3401 Encounter for supervision of normal first pregnancy, first trimester: Secondary | ICD-10-CM | POA: Diagnosis not present

## 2023-04-10 DIAGNOSIS — Z3400 Encounter for supervision of normal first pregnancy, unspecified trimester: Secondary | ICD-10-CM | POA: Diagnosis not present

## 2023-04-10 DIAGNOSIS — Z31430 Encounter of female for testing for genetic disease carrier status for procreative management: Secondary | ICD-10-CM | POA: Diagnosis not present

## 2023-04-10 DIAGNOSIS — Z3A09 9 weeks gestation of pregnancy: Secondary | ICD-10-CM | POA: Diagnosis not present

## 2023-04-10 LAB — OB RESULTS CONSOLE HIV ANTIBODY (ROUTINE TESTING): HIV: NONREACTIVE

## 2023-04-10 LAB — OB RESULTS CONSOLE GC/CHLAMYDIA
Chlamydia: NEGATIVE
Neisseria Gonorrhea: NEGATIVE

## 2023-04-10 LAB — OB RESULTS CONSOLE HEPATITIS B SURFACE ANTIGEN: Hepatitis B Surface Ag: NEGATIVE

## 2023-04-10 LAB — OB RESULTS CONSOLE RUBELLA ANTIBODY, IGM: Rubella: IMMUNE

## 2023-04-10 LAB — HEPATITIS C ANTIBODY: HCV Ab: NEGATIVE

## 2023-04-10 LAB — OB RESULTS CONSOLE RPR: RPR: NONREACTIVE

## 2023-04-16 DIAGNOSIS — Z3401 Encounter for supervision of normal first pregnancy, first trimester: Secondary | ICD-10-CM | POA: Diagnosis not present

## 2023-04-16 DIAGNOSIS — Z113 Encounter for screening for infections with a predominantly sexual mode of transmission: Secondary | ICD-10-CM | POA: Diagnosis not present

## 2023-04-16 DIAGNOSIS — Z3A10 10 weeks gestation of pregnancy: Secondary | ICD-10-CM | POA: Diagnosis not present

## 2023-04-23 DIAGNOSIS — Z3A11 11 weeks gestation of pregnancy: Secondary | ICD-10-CM | POA: Diagnosis not present

## 2023-04-23 DIAGNOSIS — O26851 Spotting complicating pregnancy, first trimester: Secondary | ICD-10-CM | POA: Diagnosis not present

## 2023-06-20 DIAGNOSIS — Z3481 Encounter for supervision of other normal pregnancy, first trimester: Secondary | ICD-10-CM | POA: Diagnosis not present

## 2023-06-20 DIAGNOSIS — Z3A19 19 weeks gestation of pregnancy: Secondary | ICD-10-CM | POA: Diagnosis not present

## 2023-06-20 DIAGNOSIS — Z363 Encounter for antenatal screening for malformations: Secondary | ICD-10-CM | POA: Diagnosis not present

## 2023-07-18 ENCOUNTER — Encounter: Payer: Self-pay | Admitting: *Deleted

## 2023-07-18 DIAGNOSIS — D259 Leiomyoma of uterus, unspecified: Secondary | ICD-10-CM | POA: Insufficient documentation

## 2023-07-18 DIAGNOSIS — O99210 Obesity complicating pregnancy, unspecified trimester: Secondary | ICD-10-CM | POA: Insufficient documentation

## 2023-07-19 ENCOUNTER — Other Ambulatory Visit: Payer: Self-pay | Admitting: Obstetrics and Gynecology

## 2023-07-19 DIAGNOSIS — D259 Leiomyoma of uterus, unspecified: Secondary | ICD-10-CM

## 2023-07-19 DIAGNOSIS — Z363 Encounter for antenatal screening for malformations: Secondary | ICD-10-CM

## 2023-07-20 ENCOUNTER — Ambulatory Visit: Payer: BC Managed Care – PPO | Attending: Obstetrics and Gynecology

## 2023-07-20 ENCOUNTER — Ambulatory Visit: Payer: BC Managed Care – PPO | Admitting: *Deleted

## 2023-07-20 VITALS — BP 113/64 | HR 103

## 2023-07-20 DIAGNOSIS — O99210 Obesity complicating pregnancy, unspecified trimester: Secondary | ICD-10-CM | POA: Insufficient documentation

## 2023-07-20 DIAGNOSIS — E669 Obesity, unspecified: Secondary | ICD-10-CM

## 2023-07-20 DIAGNOSIS — D259 Leiomyoma of uterus, unspecified: Secondary | ICD-10-CM

## 2023-07-20 DIAGNOSIS — Z363 Encounter for antenatal screening for malformations: Secondary | ICD-10-CM | POA: Insufficient documentation

## 2023-07-20 DIAGNOSIS — Z3A23 23 weeks gestation of pregnancy: Secondary | ICD-10-CM

## 2023-07-20 DIAGNOSIS — O3412 Maternal care for benign tumor of corpus uteri, second trimester: Secondary | ICD-10-CM | POA: Diagnosis not present

## 2023-07-20 DIAGNOSIS — O3410 Maternal care for benign tumor of corpus uteri, unspecified trimester: Secondary | ICD-10-CM | POA: Insufficient documentation

## 2023-07-20 DIAGNOSIS — O99212 Obesity complicating pregnancy, second trimester: Secondary | ICD-10-CM

## 2023-08-21 DIAGNOSIS — Z23 Encounter for immunization: Secondary | ICD-10-CM | POA: Diagnosis not present

## 2023-08-21 DIAGNOSIS — O99213 Obesity complicating pregnancy, third trimester: Secondary | ICD-10-CM | POA: Diagnosis not present

## 2023-08-21 DIAGNOSIS — Z3A28 28 weeks gestation of pregnancy: Secondary | ICD-10-CM | POA: Diagnosis not present

## 2023-08-21 DIAGNOSIS — Z3481 Encounter for supervision of other normal pregnancy, first trimester: Secondary | ICD-10-CM | POA: Diagnosis not present

## 2023-08-23 ENCOUNTER — Other Ambulatory Visit: Payer: Self-pay

## 2023-08-23 DIAGNOSIS — O99019 Anemia complicating pregnancy, unspecified trimester: Secondary | ICD-10-CM

## 2023-08-24 ENCOUNTER — Encounter: Payer: Self-pay | Admitting: Obstetrics and Gynecology

## 2023-08-27 ENCOUNTER — Telehealth: Payer: Self-pay | Admitting: Pharmacy Technician

## 2023-08-27 NOTE — Telephone Encounter (Signed)
Auth Submission: NO AUTH NEEDED Site of care: Site of care: [facility] Payer: [employer] Medication & CPT/J Code(s) submitted: Venofer (Iron Sucrose) J1756 Route of submission (phone, fax, portal):  Phone # Fax # Auth type: Buy/Bill PB Units/visits requested: 3 Reference number:  Approval from: 08/27/23 to 12/18/23

## 2023-08-30 ENCOUNTER — Ambulatory Visit (INDEPENDENT_AMBULATORY_CARE_PROVIDER_SITE_OTHER): Payer: BC Managed Care – PPO

## 2023-08-30 VITALS — BP 86/61 | HR 78 | Temp 98.3°F | Resp 14 | Ht 62.0 in | Wt 222.0 lb

## 2023-08-30 DIAGNOSIS — D508 Other iron deficiency anemias: Secondary | ICD-10-CM | POA: Diagnosis not present

## 2023-08-30 DIAGNOSIS — Z3A29 29 weeks gestation of pregnancy: Secondary | ICD-10-CM

## 2023-08-30 DIAGNOSIS — O99013 Anemia complicating pregnancy, third trimester: Secondary | ICD-10-CM

## 2023-08-30 DIAGNOSIS — O99019 Anemia complicating pregnancy, unspecified trimester: Secondary | ICD-10-CM

## 2023-08-30 MED ORDER — SODIUM CHLORIDE 0.9 % IV SOLN
300.0000 mg | Freq: Once | INTRAVENOUS | Status: AC
  Administered 2023-08-30: 300 mg via INTRAVENOUS
  Filled 2023-08-30: qty 15

## 2023-08-30 MED ORDER — ACETAMINOPHEN 325 MG PO TABS
650.0000 mg | ORAL_TABLET | Freq: Once | ORAL | Status: AC
  Administered 2023-08-30: 650 mg via ORAL
  Filled 2023-08-30: qty 2

## 2023-08-30 MED ORDER — DIPHENHYDRAMINE HCL 25 MG PO CAPS
25.0000 mg | ORAL_CAPSULE | Freq: Once | ORAL | Status: AC
  Administered 2023-08-30: 25 mg via ORAL
  Filled 2023-08-30: qty 1

## 2023-08-30 NOTE — Progress Notes (Signed)
Diagnosis: Iron Deficiency Anemia  Provider:  Chilton Greathouse MD  Procedure: IV Infusion  IV Type: Peripheral, IV Location: L Antecubital  Venofer (Iron Sucrose), Dose: 300 mg  Infusion Start Time: 1402  Infusion Stop Time: 1542  Post Infusion IV Care: Observation period completed and Peripheral IV Discontinued  Discharge: Condition: Good, Destination: Home . AVS Declined  Performed by:  Loney Hering, LPN

## 2023-09-06 ENCOUNTER — Ambulatory Visit: Payer: BC Managed Care – PPO

## 2023-09-06 ENCOUNTER — Encounter: Payer: Self-pay | Admitting: Obstetrics and Gynecology

## 2023-09-06 VITALS — BP 100/65 | HR 84 | Temp 98.0°F | Resp 18 | Ht 62.0 in | Wt 223.8 lb

## 2023-09-06 DIAGNOSIS — D508 Other iron deficiency anemias: Secondary | ICD-10-CM

## 2023-09-06 DIAGNOSIS — O99019 Anemia complicating pregnancy, unspecified trimester: Secondary | ICD-10-CM

## 2023-09-06 DIAGNOSIS — O99013 Anemia complicating pregnancy, third trimester: Secondary | ICD-10-CM

## 2023-09-06 DIAGNOSIS — Z3A30 30 weeks gestation of pregnancy: Secondary | ICD-10-CM

## 2023-09-06 MED ORDER — SODIUM CHLORIDE 0.9 % IV SOLN
300.0000 mg | Freq: Once | INTRAVENOUS | Status: AC
  Administered 2023-09-06: 300 mg via INTRAVENOUS
  Filled 2023-09-06: qty 15

## 2023-09-06 MED ORDER — ACETAMINOPHEN 325 MG PO TABS
650.0000 mg | ORAL_TABLET | Freq: Once | ORAL | Status: AC
  Administered 2023-09-06: 650 mg via ORAL
  Filled 2023-09-06: qty 2

## 2023-09-06 MED ORDER — DIPHENHYDRAMINE HCL 25 MG PO CAPS
25.0000 mg | ORAL_CAPSULE | Freq: Once | ORAL | Status: AC
  Administered 2023-09-06: 25 mg via ORAL
  Filled 2023-09-06: qty 1

## 2023-09-06 NOTE — Progress Notes (Signed)
Diagnosis: Iron Deficiency Anemia  Provider:  Chilton Greathouse MD  Procedure: IV Infusion  IV Type: Peripheral, IV Location: R Forearm  Venofer (Iron Sucrose), Dose: 300 mg  Infusion Start Time: 1359  Infusion Stop Time: 1535  Post Infusion IV Care: Observation period completed and Peripheral IV Discontinued  Discharge: Condition: Good, Destination: Home . AVS Declined  Performed by:  Adriana Mccallum, RN

## 2023-09-13 ENCOUNTER — Ambulatory Visit: Payer: BC Managed Care – PPO

## 2023-09-13 VITALS — BP 101/68 | HR 88 | Temp 98.2°F | Resp 14 | Ht 62.0 in | Wt 224.6 lb

## 2023-09-13 DIAGNOSIS — Z3A31 31 weeks gestation of pregnancy: Secondary | ICD-10-CM

## 2023-09-13 DIAGNOSIS — O99019 Anemia complicating pregnancy, unspecified trimester: Secondary | ICD-10-CM

## 2023-09-13 DIAGNOSIS — O99013 Anemia complicating pregnancy, third trimester: Secondary | ICD-10-CM

## 2023-09-13 DIAGNOSIS — D508 Other iron deficiency anemias: Secondary | ICD-10-CM | POA: Diagnosis not present

## 2023-09-13 MED ORDER — SODIUM CHLORIDE 0.9 % IV SOLN
300.0000 mg | Freq: Once | INTRAVENOUS | Status: AC
  Administered 2023-09-13: 300 mg via INTRAVENOUS
  Filled 2023-09-13: qty 15

## 2023-09-13 MED ORDER — DIPHENHYDRAMINE HCL 25 MG PO CAPS
25.0000 mg | ORAL_CAPSULE | Freq: Once | ORAL | Status: AC
  Administered 2023-09-13: 25 mg via ORAL
  Filled 2023-09-13: qty 1

## 2023-09-13 MED ORDER — ACETAMINOPHEN 325 MG PO TABS
650.0000 mg | ORAL_TABLET | Freq: Once | ORAL | Status: AC
  Administered 2023-09-13: 650 mg via ORAL
  Filled 2023-09-13: qty 2

## 2023-09-13 NOTE — Progress Notes (Signed)
Diagnosis: Acute Anemia  Provider:  Chilton Greathouse MD  Procedure: IV Infusion  IV Type: Peripheral, IV Location: L Forearm  Venofer (Iron Sucrose), Dose: 300 mg  Infusion Start Time: 1402  Infusion Stop Time: 1543  Post Infusion IV Care: Observation period completed and Peripheral IV Discontinued  Discharge: Condition: Stable, Destination: Home . AVS Declined  Performed by:  Wyvonne Lenz, RN

## 2023-09-19 DIAGNOSIS — Z3A32 32 weeks gestation of pregnancy: Secondary | ICD-10-CM | POA: Diagnosis not present

## 2023-09-19 DIAGNOSIS — O99213 Obesity complicating pregnancy, third trimester: Secondary | ICD-10-CM | POA: Diagnosis not present

## 2023-09-27 DIAGNOSIS — Z23 Encounter for immunization: Secondary | ICD-10-CM | POA: Diagnosis not present

## 2023-09-27 DIAGNOSIS — O99891 Other specified diseases and conditions complicating pregnancy: Secondary | ICD-10-CM | POA: Diagnosis not present

## 2023-09-27 DIAGNOSIS — Z3A33 33 weeks gestation of pregnancy: Secondary | ICD-10-CM | POA: Diagnosis not present

## 2023-10-03 DIAGNOSIS — O99891 Other specified diseases and conditions complicating pregnancy: Secondary | ICD-10-CM | POA: Diagnosis not present

## 2023-10-03 DIAGNOSIS — Z3A34 34 weeks gestation of pregnancy: Secondary | ICD-10-CM | POA: Diagnosis not present

## 2023-10-10 DIAGNOSIS — O99891 Other specified diseases and conditions complicating pregnancy: Secondary | ICD-10-CM | POA: Diagnosis not present

## 2023-10-10 DIAGNOSIS — Z3A35 35 weeks gestation of pregnancy: Secondary | ICD-10-CM | POA: Diagnosis not present

## 2023-10-17 DIAGNOSIS — Z3A36 36 weeks gestation of pregnancy: Secondary | ICD-10-CM | POA: Diagnosis not present

## 2023-10-17 DIAGNOSIS — Z3685 Encounter for antenatal screening for Streptococcus B: Secondary | ICD-10-CM | POA: Diagnosis not present

## 2023-10-17 DIAGNOSIS — O99213 Obesity complicating pregnancy, third trimester: Secondary | ICD-10-CM | POA: Diagnosis not present

## 2023-10-17 LAB — OB RESULTS CONSOLE GBS: GBS: NEGATIVE

## 2023-10-22 ENCOUNTER — Other Ambulatory Visit: Payer: Self-pay

## 2023-10-22 ENCOUNTER — Observation Stay (HOSPITAL_COMMUNITY)
Admission: AD | Admit: 2023-10-22 | Discharge: 2023-10-23 | Disposition: A | Discharge status: Home / Self Care | Payer: BC Managed Care – PPO | Attending: Obstetrics and Gynecology | Admitting: Obstetrics and Gynecology

## 2023-10-22 ENCOUNTER — Encounter (HOSPITAL_COMMUNITY): Payer: Self-pay | Admitting: Obstetrics and Gynecology

## 2023-10-22 DIAGNOSIS — O26893 Other specified pregnancy related conditions, third trimester: Secondary | ICD-10-CM | POA: Diagnosis not present

## 2023-10-22 DIAGNOSIS — R1032 Left lower quadrant pain: Secondary | ICD-10-CM | POA: Insufficient documentation

## 2023-10-22 DIAGNOSIS — Z3A37 37 weeks gestation of pregnancy: Principal | ICD-10-CM | POA: Insufficient documentation

## 2023-10-22 DIAGNOSIS — W2210XA Striking against or struck by unspecified automobile airbag, initial encounter: Secondary | ICD-10-CM

## 2023-10-22 DIAGNOSIS — O9A213 Injury, poisoning and certain other consequences of external causes complicating pregnancy, third trimester: Principal | ICD-10-CM | POA: Insufficient documentation

## 2023-10-22 LAB — URINALYSIS, ROUTINE W REFLEX MICROSCOPIC
Bilirubin Urine: NEGATIVE
Glucose, UA: NEGATIVE mg/dL
Hgb urine dipstick: NEGATIVE
Ketones, ur: NEGATIVE mg/dL
Leukocytes,Ua: NEGATIVE
Nitrite: NEGATIVE
Protein, ur: NEGATIVE mg/dL
Specific Gravity, Urine: 1.006 (ref 1.005–1.030)
pH: 6 (ref 5.0–8.0)
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
## 2023-10-23 ENCOUNTER — Inpatient Hospital Stay (HOSPITAL_COMMUNITY): Payer: BC Managed Care – PPO

## 2023-10-23 DIAGNOSIS — O471 False labor at or after 37 completed weeks of gestation: Secondary | ICD-10-CM

## 2023-10-23 DIAGNOSIS — O9A213 Injury, poisoning and certain other consequences of external causes complicating pregnancy, third trimester: Secondary | ICD-10-CM

## 2023-10-23 DIAGNOSIS — R1032 Left lower quadrant pain: Secondary | ICD-10-CM | POA: Diagnosis not present

## 2023-10-23 DIAGNOSIS — Z3A37 37 weeks gestation of pregnancy: Secondary | ICD-10-CM

## 2023-10-23 DIAGNOSIS — W2210XA Striking against or struck by unspecified automobile airbag, initial encounter: Secondary | ICD-10-CM | POA: Diagnosis not present

## 2023-10-23 LAB — TYPE AND SCREEN
ABO/RH(D): O POS
Antibody Screen: NEGATIVE

## 2023-10-23 LAB — CBC
HCT: 30.9 % — ABNORMAL LOW (ref 36.0–46.0)
Hemoglobin: 10 g/dL — ABNORMAL LOW (ref 12.0–15.0)
MCH: 26.5 pg (ref 26.0–34.0)
MCHC: 32.4 g/dL (ref 30.0–36.0)
MCV: 81.7 fL (ref 80.0–100.0)
Platelets: 249 10*3/uL (ref 150–400)
RBC: 3.78 MIL/uL — ABNORMAL LOW (ref 3.87–5.11)
RDW: 16.8 % — ABNORMAL HIGH (ref 11.5–15.5)
WBC: 7.1 10*3/uL (ref 4.0–10.5)
nRBC: 0 % (ref 0.0–0.2)

## 2023-10-23 LAB — FIBRINOGEN: Fibrinogen: >800 mg/dL — ABNORMAL HIGH (ref 210–475)

## 2023-10-23 MED ORDER — DOCUSATE SODIUM 100 MG PO CAPS
100.0000 mg | ORAL_CAPSULE | Freq: Every day | ORAL | Status: DC
  Filled 2023-10-23: qty 1

## 2023-10-23 MED ORDER — FENTANYL CITRATE (PF) 100 MCG/2ML IJ SOLN
50.0000 ug | INTRAMUSCULAR | Status: DC | PRN
Start: 2023-10-23 — End: 2023-10-24

## 2023-10-23 MED ORDER — SODIUM CHLORIDE 0.9% FLUSH
10.0000 mL | Freq: Two times a day (BID) | INTRAVENOUS | Status: DC
  Administered 2023-10-23: 10 mL via INTRAVENOUS

## 2023-10-23 MED ORDER — ACETAMINOPHEN 325 MG PO TABS
650.0000 mg | ORAL_TABLET | ORAL | Status: DC | PRN
  Administered 2023-10-23 (×2): 650 mg via ORAL
  Filled 2023-10-23 (×2): qty 2

## 2023-10-23 MED ORDER — ZOLPIDEM TARTRATE 5 MG PO TABS: 5.0000 mg | ORAL_TABLET | Freq: Every evening | ORAL | Status: DC | PRN

## 2023-10-23 MED ORDER — CALCIUM CARBONATE ANTACID 500 MG PO CHEW: 2.0000 | CHEWABLE_TABLET | ORAL | Status: DC | PRN

## 2023-10-23 MED ORDER — PRENATAL MULTIVITAMIN CH
1.0000 | ORAL_TABLET | Freq: Every day | ORAL | Status: DC
  Administered 2023-10-23: 1 via ORAL
  Filled 2023-10-23: qty 1

## 2023-10-23 NOTE — Discharge Instructions (Signed)
Call MD for vaginal bleeding, CTX, abdominal pain, decreased FM.  F/U at office visit tomorrow.
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
## 2023-10-23 NOTE — Progress Notes (Signed)
RN took pt to [place] - pt to go to room 115 in [facility] once [place] is completed.
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
## 2023-10-24 DIAGNOSIS — O99213 Obesity complicating pregnancy, third trimester: Secondary | ICD-10-CM | POA: Diagnosis not present

## 2023-10-24 DIAGNOSIS — Z3A37 37 weeks gestation of pregnancy: Secondary | ICD-10-CM | POA: Diagnosis not present

## 2023-10-31 ENCOUNTER — Encounter: Payer: Self-pay | Admitting: Obstetrics and Gynecology

## 2023-10-31 DIAGNOSIS — Z3A38 38 weeks gestation of pregnancy: Secondary | ICD-10-CM | POA: Diagnosis not present

## 2023-10-31 DIAGNOSIS — O99213 Obesity complicating pregnancy, third trimester: Secondary | ICD-10-CM | POA: Diagnosis not present

## 2023-10-31 DIAGNOSIS — O09213 Supervision of pregnancy with history of pre-term labor, third trimester: Secondary | ICD-10-CM | POA: Diagnosis not present

## 2023-11-01 ENCOUNTER — Telehealth (HOSPITAL_COMMUNITY): Payer: Self-pay | Admitting: *Deleted

## 2023-11-01 ENCOUNTER — Encounter (HOSPITAL_COMMUNITY): Payer: Self-pay | Admitting: *Deleted

## 2023-11-01 ENCOUNTER — Encounter (HOSPITAL_COMMUNITY): Payer: Self-pay

## 2023-11-01 NOTE — Telephone Encounter (Signed)
Preadmission screen  

## 2023-11-06 DIAGNOSIS — M5489 Other dorsalgia: Secondary | ICD-10-CM | POA: Diagnosis not present

## 2023-11-07 DIAGNOSIS — O900 Disruption of cesarean delivery wound: Secondary | ICD-10-CM | POA: Diagnosis not present
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
## 2023-11-08 ENCOUNTER — Inpatient Hospital Stay (HOSPITAL_COMMUNITY)
Admission: RE | Admit: 2023-11-08 | Discharge: 2023-11-10 | DRG: 807 | Disposition: A | Discharge status: Home / Self Care | Payer: BC Managed Care – PPO | Attending: Obstetrics and Gynecology | Admitting: Obstetrics and Gynecology

## 2023-11-08 ENCOUNTER — Inpatient Hospital Stay (HOSPITAL_COMMUNITY): Payer: BC Managed Care – PPO | Admitting: Anesthesiology

## 2023-11-08 ENCOUNTER — Inpatient Hospital Stay (HOSPITAL_COMMUNITY): Payer: BC Managed Care – PPO

## 2023-11-08 ENCOUNTER — Encounter (HOSPITAL_COMMUNITY): Payer: Self-pay | Admitting: Obstetrics and Gynecology

## 2023-11-08 DIAGNOSIS — E66813 Obesity, class 3: Secondary | ICD-10-CM | POA: Diagnosis present

## 2023-11-08 DIAGNOSIS — Z833 Family history of diabetes mellitus: Secondary | ICD-10-CM | POA: Diagnosis not present

## 2023-11-08 DIAGNOSIS — Z3490 Encounter for supervision of normal pregnancy, unspecified, unspecified trimester: Principal | ICD-10-CM

## 2023-11-08 DIAGNOSIS — O43193 Other malformation of placenta, third trimester: Secondary | ICD-10-CM | POA: Diagnosis present

## 2023-11-08 DIAGNOSIS — O26893 Other specified pregnancy related conditions, third trimester: Secondary | ICD-10-CM | POA: Diagnosis not present

## 2023-11-08 DIAGNOSIS — Z3A39 39 weeks gestation of pregnancy: Secondary | ICD-10-CM

## 2023-11-08 DIAGNOSIS — Z23 Encounter for immunization: Secondary | ICD-10-CM | POA: Diagnosis not present

## 2023-11-08 DIAGNOSIS — O99214 Obesity complicating childbirth: Secondary | ICD-10-CM | POA: Diagnosis not present

## 2023-11-08 DIAGNOSIS — D259 Leiomyoma of uterus, unspecified: Secondary | ICD-10-CM | POA: Diagnosis not present

## 2023-11-08 DIAGNOSIS — O3413 Maternal care for benign tumor of corpus uteri, third trimester: Principal | ICD-10-CM | POA: Diagnosis present

## 2023-11-08 LAB — TYPE AND SCREEN
ABO/RH(D): O POS
Antibody Screen: NEGATIVE

## 2023-11-08 LAB — CBC
HCT: 30.9 % — ABNORMAL LOW (ref 36.0–46.0)
Hemoglobin: 10.3 g/dL — ABNORMAL LOW (ref 12.0–15.0)
MCH: 27.6 pg (ref 26.0–34.0)
MCHC: 33.3 g/dL (ref 30.0–36.0)
MCV: 82.8 fL (ref 80.0–100.0)
Platelets: 252 10*3/uL (ref 150–400)
RBC: 3.73 MIL/uL — ABNORMAL LOW (ref 3.87–5.11)
RDW: 16.7 % — ABNORMAL HIGH (ref 11.5–15.5)
WBC: 8.1 10*3/uL (ref 4.0–10.5)
nRBC: 0 % (ref 0.0–0.2)

## 2023-11-08 LAB — RPR: RPR Ser Ql: NONREACTIVE

## 2023-11-08 LAB — HIV ANTIBODY (ROUTINE TESTING W REFLEX): HIV Screen 4th Generation wRfx: NONREACTIVE

## 2023-11-08 MED ORDER — LACTATED RINGERS IV SOLN: INTRAVENOUS | Status: DC

## 2023-11-08 MED ORDER — PRENATAL MULTIVITAMIN CH
1.0000 | ORAL_TABLET | Freq: Every day | ORAL | Status: DC
  Administered 2023-11-09 – 2023-11-10 (×2): 1 via ORAL
  Filled 2023-11-08 (×2): qty 1

## 2023-11-08 MED ORDER — OXYCODONE-ACETAMINOPHEN 5-325 MG PO TABS: 2.0000 | ORAL_TABLET | ORAL | Status: DC | PRN

## 2023-11-08 MED ORDER — SIMETHICONE 80 MG PO CHEW: 80.0000 mg | CHEWABLE_TABLET | ORAL | Status: DC | PRN

## 2023-11-08 MED ORDER — TRANEXAMIC ACID-NACL 1000-0.7 MG/100ML-% IV SOLN
1000.0000 mg | INTRAVENOUS | Status: AC
  Administered 2023-11-08: 1000 mg via INTRAVENOUS

## 2023-11-08 MED ORDER — SOD CITRATE-CITRIC ACID 500-334 MG/5ML PO SOLN
30.0000 mL | ORAL | Status: DC | PRN
Start: 2023-11-08 — End: 2023-11-08

## 2023-11-08 MED ORDER — PHENYLEPHRINE 80 MCG/ML (10ML) SYRINGE FOR IV PUSH (FOR BLOOD PRESSURE SUPPORT): 80.0000 ug | PREFILLED_SYRINGE | INTRAVENOUS | Status: DC | PRN

## 2023-11-08 MED ORDER — ONDANSETRON HCL 4 MG/2ML IJ SOLN: 4.0000 mg | INTRAMUSCULAR | Status: DC | PRN

## 2023-11-08 MED ORDER — ZOLPIDEM TARTRATE 5 MG PO TABS: 5.0000 mg | ORAL_TABLET | Freq: Every evening | ORAL | Status: DC | PRN

## 2023-11-08 MED ORDER — BENZOCAINE-MENTHOL 20-0.5 % EX AERO: 1.0000 | INHALATION_SPRAY | CUTANEOUS | Status: DC | PRN

## 2023-11-08 MED ORDER — OXYCODONE HCL 5 MG PO TABS: 5.0000 mg | ORAL_TABLET | ORAL | Status: DC | PRN

## 2023-11-08 MED ORDER — LIDOCAINE HCL (PF) 1 % IJ SOLN: 30.0000 mL | INTRAMUSCULAR | Status: DC | PRN

## 2023-11-08 MED ORDER — LACTATED RINGERS IV SOLN
500.0000 mL | Freq: Once | INTRAVENOUS | Status: AC
  Administered 2023-11-08: 500 mL via INTRAVENOUS

## 2023-11-08 MED ORDER — LIDOCAINE HCL (PF) 1 % IJ SOLN
INTRAMUSCULAR | Status: DC | PRN
  Administered 2023-11-08 (×2): 4 mL via EPIDURAL

## 2023-11-08 MED ORDER — TERBUTALINE SULFATE 1 MG/ML IJ SOLN: 0.2500 mg | Freq: Once | INTRAMUSCULAR | Status: DC | PRN

## 2023-11-08 MED ORDER — SENNOSIDES-DOCUSATE SODIUM 8.6-50 MG PO TABS
2.0000 | ORAL_TABLET | ORAL | Status: DC
  Administered 2023-11-09 – 2023-11-10 (×2): 2 via ORAL
  Filled 2023-11-08 (×2): qty 2

## 2023-11-08 MED ORDER — EPHEDRINE 5 MG/ML INJ: 10.0000 mg | INTRAVENOUS | Status: DC | PRN

## 2023-11-08 MED ORDER — MISOPROSTOL 25 MCG QUARTER TABLET
25.0000 ug | ORAL_TABLET | ORAL | Status: DC | PRN
Start: 2023-11-08 — End: 2023-11-08
  Administered 2023-11-08 (×2): 25 ug via VAGINAL
  Filled 2023-11-08 (×3): qty 1

## 2023-11-08 MED ORDER — LACTATED RINGERS IV SOLN: 500.0000 mL | INTRAVENOUS | Status: DC | PRN

## 2023-11-08 MED ORDER — DIPHENHYDRAMINE HCL 25 MG PO CAPS: 25.0000 mg | ORAL_CAPSULE | Freq: Four times a day (QID) | ORAL | Status: DC | PRN

## 2023-11-08 MED ORDER — COCONUT OIL OIL: 1.0000 | TOPICAL_OIL | Status: DC | PRN

## 2023-11-08 MED ORDER — ONDANSETRON HCL 4 MG PO TABS: 4.0000 mg | ORAL_TABLET | ORAL | Status: DC | PRN

## 2023-11-08 MED ORDER — WITCH HAZEL-GLYCERIN EX PADS: 1.0000 | MEDICATED_PAD | CUTANEOUS | Status: DC | PRN

## 2023-11-08 MED ORDER — MISOPROSTOL 25 MCG QUARTER TABLET
25.0000 ug | ORAL_TABLET | Freq: Once | ORAL | Status: AC
Start: 2023-11-08 — End: 2023-11-08
  Administered 2023-11-08: 25 ug via ORAL
  Filled 2023-11-08: qty 1

## 2023-11-08 MED ORDER — OXYTOCIN BOLUS FROM INFUSION
333.0000 mL | Freq: Once | INTRAVENOUS | Status: AC
  Administered 2023-11-08: 333 mL via INTRAVENOUS

## 2023-11-08 MED ORDER — OXYCODONE-ACETAMINOPHEN 5-325 MG PO TABS: 1.0000 | ORAL_TABLET | ORAL | Status: DC | PRN

## 2023-11-08 MED ORDER — HYDROXYZINE HCL 50 MG PO TABS
50.0000 mg | ORAL_TABLET | Freq: Four times a day (QID) | ORAL | Status: DC | PRN
Start: 2023-11-08 — End: 2023-11-08

## 2023-11-08 MED ORDER — IBUPROFEN 600 MG PO TABS
600.0000 mg | ORAL_TABLET | Freq: Four times a day (QID) | ORAL | Status: DC
  Administered 2023-11-09 – 2023-11-10 (×7): 600 mg via ORAL
  Filled 2023-11-08 (×7): qty 1

## 2023-11-08 MED ORDER — DIBUCAINE (PERIANAL) 1 % EX OINT: 1.0000 | TOPICAL_OINTMENT | CUTANEOUS | Status: DC | PRN

## 2023-11-08 MED ORDER — TRANEXAMIC ACID-NACL 1000-0.7 MG/100ML-% IV SOLN
INTRAVENOUS | Status: AC
  Filled 2023-11-08: qty 100

## 2023-11-08 MED ORDER — TETANUS-DIPHTH-ACELL PERTUSSIS 5-2.5-18.5 LF-MCG/0.5 IM SUSY: 0.5000 mL | PREFILLED_SYRINGE | Freq: Once | INTRAMUSCULAR | Status: DC

## 2023-11-08 MED ORDER — DIPHENHYDRAMINE HCL 50 MG/ML IJ SOLN: 12.5000 mg | INTRAMUSCULAR | Status: DC | PRN

## 2023-11-08 MED ORDER — ACETAMINOPHEN 325 MG PO TABS
650.0000 mg | ORAL_TABLET | ORAL | Status: DC | PRN
Start: 2023-11-08 — End: 2023-11-08

## 2023-11-08 MED ORDER — OXYTOCIN-SODIUM CHLORIDE 30-0.9 UT/500ML-% IV SOLN
2.5000 [IU]/h | INTRAVENOUS | Status: DC
  Administered 2023-11-08: 2.5 [IU]/h via INTRAVENOUS
  Filled 2023-11-08: qty 500

## 2023-11-08 MED ORDER — ONDANSETRON HCL 4 MG/2ML IJ SOLN
4.0000 mg | Freq: Four times a day (QID) | INTRAMUSCULAR | Status: DC | PRN
  Administered 2023-11-08: 4 mg via INTRAVENOUS
  Filled 2023-11-08: qty 2

## 2023-11-08 MED ORDER — FERROUS SULFATE 325 (65 FE) MG PO TABS
325.0000 mg | ORAL_TABLET | Freq: Every day | ORAL | Status: DC
  Administered 2023-11-09 – 2023-11-10 (×2): 325 mg via ORAL
  Filled 2023-11-08 (×2): qty 1

## 2023-11-08 MED ORDER — ACETAMINOPHEN 325 MG PO TABS: 650.0000 mg | ORAL_TABLET | ORAL | Status: DC | PRN

## 2023-11-08 MED ORDER — FENTANYL CITRATE (PF) 100 MCG/2ML IJ SOLN
50.0000 ug | INTRAMUSCULAR | Status: DC | PRN
  Administered 2023-11-08: 100 ug via INTRAVENOUS
  Filled 2023-11-08: qty 2

## 2023-11-08 MED ORDER — FENTANYL-BUPIVACAINE-NACL 0.5-0.125-0.9 MG/250ML-% EP SOLN
12.0000 mL/h | EPIDURAL | Status: DC | PRN
  Administered 2023-11-08: 12 mL/h via EPIDURAL
  Filled 2023-11-08: qty 250

## 2023-11-08 NOTE — Progress Notes (Signed)
Labor Progress Note  Patient very comfortable with epidural.  S/p most recent cytotec 4 hrs ago.  On exam, bulging bag noted with head well applied.  AROM completed in standard fashion.  Exam immediately after confirms no cord, head well applied. No cervix noted and fluid flowing.  Will sit up and allow head and fluid to settle, recheck shortly and begin pushing if complete dilation is confirmed.   FHT has been with intermittent late decels. These have been responsive to position changes and IVF. After AROM, variable noted. Accel present.  Anticipate pushing soon.   Nilda Simmer MD

## 2023-11-08 NOTE — Lactation Note (Signed)
This note was copied from a baby's chart. Lactation Consultation Note  Patient Name: Anne Anderson [id number] Date: 11/08/2023 Age:33 hours Reason for consult: Initial assessment;Primapara;1st time breastfeeding;Term;Breastfeeding assistance  P1- MOB stated that they were not able to get a good latch down in L&D for a while, so she would like assistance with latching infant. LC assisted with placing infant on the left breast in the football hold. Infant was eager and latched immediately. MOB showed excitement. MOB asked LC to demonstrate hand expression on the right breast while infant is nursing. LC was demonstrated how to hand express and was able to express 1 drop after 2 expressions.   LC reviewed feeding infant on cue 8-12x in 24 hrs, not allowing infant to go past 3 hrs without a feeding, CDC milk storage guidelines and LC services handout. LC encouraged MOB to call for further assistance as needed.  Maternal Data Has patient been taught Hand Expression?: Yes Does the patient have breastfeeding experience prior to this delivery?: No  Feeding Mother's Current Feeding Choice: Breast Milk  LATCH Score Latch: Grasps breast easily, tongue down, lips flanged, rhythmical sucking.  Audible Swallowing: Spontaneous and intermittent  Type of Nipple: Everted at rest and after stimulation  Comfort (Breast/Nipple): Soft / non-tender  Hold (Positioning): Assistance needed to correctly position infant at breast and maintain latch.  LATCH Score: 9   Lactation Tools Discussed/Used Pump Education: Milk Storage  Interventions Interventions: Breast feeding basics reviewed;Assisted with latch;Breast compression;Adjust position;Support pillows;Position options;Expressed milk;Hand express;Education;LC Services brochure  Discharge Discharge Education: Warning signs for feeding baby Pump: Hands Free;Manual;Personal  Consult Status Consult Status: Follow-up Date: 11/09/23 Follow-up type:  In-patient    Dema Severin BS, IBCLC 11/08/2023, 10:54 PM

## 2023-11-08 NOTE — Anesthesia Preprocedure Evaluation (Signed)
Anesthesia Evaluation  Patient identified by MRN, date of birth, ID band Patient awake    Reviewed: Allergy & Precautions, Patient's Chart, lab work & pertinent test results  History of Anesthesia Complications Negative for: history of anesthetic complications  Airway Mallampati: II  TM Distance: >3 FB Neck ROM: Full    Dental no notable dental hx.    Pulmonary neg pulmonary ROS   Pulmonary exam normal        Cardiovascular negative cardio ROS Normal cardiovascular exam     Neuro/Psych negative neurological ROS  negative psych ROS   GI/Hepatic negative GI ROS, Neg liver ROS,,,  Endo/Other    Class 3 obesity  Renal/GU negative Renal ROS  negative genitourinary   Musculoskeletal negative musculoskeletal ROS (+)    Abdominal   Peds  Hematology negative hematology ROS (+)   Anesthesia Other Findings Day of surgery medications reviewed with patient.  Reproductive/Obstetrics (+) Pregnancy                              Anesthesia Physical Anesthesia Plan  ASA: 3  Anesthesia Plan: Epidural   Post-op Pain Management:    Induction:   PONV Risk Score and Plan: Treatment may vary due to age or medical condition  Airway Management Planned: Natural Airway  Additional Equipment: Fetal Monitoring  Intra-op Plan:   Post-operative Plan:   Informed Consent: I have reviewed the patients History and Physical, chart, labs and discussed the procedure including the risks, benefits and alternatives for the proposed anesthesia with the patient or authorized representative who has indicated his/her understanding and acceptance.       Plan Discussed with:   Anesthesia Plan Comments:          Anesthesia Quick Evaluation

## 2023-11-08 NOTE — Progress Notes (Signed)
Labor Progress Note  Patient s/p cytotec x 2.  Most recent 06:56.  BP (!) 106/48   Pulse 63   Temp 98.1 F (36.7 C) (Oral)   Resp 16   Ht 5\' 2"  (1.575 m)   Wt 104.6 kg   LMP 02/09/2023   SpO2 99%   BMI 42.20 kg/m   Contractions present.  FHT currently cat 1.  HGB 10.3  Continue IOL.   Nilda Simmer

## 2023-11-08 NOTE — Anesthesia Procedure Notes (Signed)
Epidural Patient location during procedure: OB Start time: 11/08/2023 12:26 PM End time: 11/08/2023 12:31 PM  Staffing Anesthesiologist: Kaylyn Layer, MD Performed: anesthesiologist   Preanesthetic Checklist Completed: patient identified, IV checked, risks and benefits discussed, monitors and equipment checked, pre-op evaluation and timeout performed  Epidural Patient position: sitting Prep: DuraPrep and site prepped and draped Patient monitoring: continuous pulse ox, blood pressure and heart rate Approach: midline Location: L3-L4 Injection technique: LOR air  Needle:  Needle type: Tuohy  Needle gauge: 17 G Needle length: 9 cm Needle insertion depth: 7 cm Catheter type: closed end flexible Catheter size: 19 Gauge Catheter at skin depth: 12 cm Test dose: negative and Other (1% lidocaine)  Assessment Events: blood not aspirated, no cerebrospinal fluid, injection not painful, no injection resistance, no paresthesia and negative IV test  Additional Notes Patient identified. Risks, benefits, and alternatives discussed with patient including but not limited to bleeding, infection, nerve damage, paralysis, failed block, incomplete pain control, headache, blood pressure changes, nausea, vomiting, reactions to medication, itching, and postpartum back pain. Confirmed with bedside nurse the patient's most recent platelet count. Confirmed with patient that they are not currently taking any anticoagulation, have any bleeding history, or any family history of bleeding disorders. Patient expressed understanding and wished to proceed. All questions were answered. Sterile technique was used throughout the entire procedure. Please see nursing notes for vital signs.   2 attempts required, some difficulty due to patient positioning/inability to remain still. Test dose was given through epidural catheter and negative prior to continuing to dose epidural or start infusion. Warning signs of high  block given to the patient including shortness of breath, tingling/numbness in hands, complete motor block, or any concerning symptoms with instructions to call for help. Patient was given instructions on fall risk and not to get out of bed. All questions and concerns addressed with instructions to call with any issues or inadequate analgesia.  Reason for block:procedure for pain

## 2023-11-09 ENCOUNTER — Other Ambulatory Visit: Payer: Self-pay

## 2023-11-09 LAB — CBC
HCT: 27.8 % — ABNORMAL LOW (ref 36.0–46.0)
Hemoglobin: 9.1 g/dL — ABNORMAL LOW (ref 12.0–15.0)
MCH: 27.4 pg (ref 26.0–34.0)
MCHC: 32.7 g/dL (ref 30.0–36.0)
MCV: 83.7 fL (ref 80.0–100.0)
Platelets: 206 10*3/uL (ref 150–400)
RBC: 3.32 MIL/uL — ABNORMAL LOW (ref 3.87–5.11)
RDW: 16.8 % — ABNORMAL HIGH (ref 11.5–15.5)
WBC: 14.2 10*3/uL — ABNORMAL HIGH (ref 4.0–10.5)
nRBC: 0 % (ref 0.0–0.2)

## 2023-11-09 NOTE — Anesthesia Postprocedure Evaluation (Signed)
Anesthesia Post Note  Patient: [employer]  Procedure(s) Performed: AN AD HOC LABOR EPIDURAL     Patient location during evaluation: Mother Baby Anesthesia Type: Epidural Level of consciousness: awake and alert Pain management: pain level controlled Vital Signs Assessment: post-procedure vital signs reviewed and stable Respiratory status: spontaneous breathing, nonlabored ventilation and respiratory function stable Cardiovascular status: stable Postop Assessment: no headache, no backache, epidural receding, no apparent nausea or vomiting, patient able to bend at knees, able to ambulate and adequate PO intake Anesthetic complications: no   No notable events documented.  Last Vitals:  Vitals:   11/09/23 0330 11/09/23 0738  BP: 109/60 (!) 97/54  Pulse: 67 (!) 57  Resp: 20 17  Temp: 36.7 C 36.7 C  SpO2: 99% 99%    Last Pain:  Vitals:   11/09/23 0738  TempSrc: Oral  PainSc: 0-No pain   Pain Goal:                   [employer]

## 2023-11-09 NOTE — Lactation Note (Signed)
This note was copied from a baby's chart. Lactation Consultation Note  Patient Name: Anne Anderson [id number] Date: 11/09/2023 Age:33 hours   MOB informed LC infant recently feed formula but she would like latch assistance with infant's next feeding. [facility] written her name on the white board for MOB to call when ready for assistance with latching infant at the breast.   Maternal Data    Feeding Nipple Type: Regular  LATCH Score                    Lactation Tools Discussed/Used    Interventions    Discharge    Consult Status      Anne Anderson 11/09/2023, 4:55 PM

## 2023-11-09 NOTE — Lactation Note (Signed)
This note was copied from a baby's chart. Lactation Consultation Note  Patient Name: Anne Anderson [id number] Date: 11/09/2023 Age:33 hours Reason for consult: 1st time breastfeeding;Difficult latch;Mother's request;Term MOB latched infant in cross cradle hold on her left breast with pillow support, infant sustained latch and BF for 17 minutes, afterwards infant was supplemented with 12 mls of formula using slow flow bottle nipple by MGF while MOB used DEBP. Per MOB, this is 1st time infant sustained his latch. MOB will continue to work on latching infant at the breast and knows to ask for further latch assistance if needed. MOB will continue to  latch infant first for every feeding and BF infant by cues, on demand, every 2-3 hours skin to skin.   Maternal Data Has patient been taught Hand Expression?: Yes Does the patient have breastfeeding experience prior to this delivery?: No  Feeding Mother's Current Feeding Choice: Breast Milk and Formula  LATCH Score Latch: Grasps breast easily, tongue down, lips flanged, rhythmical sucking.  Audible Swallowing: A few with stimulation  Type of Nipple: Everted at rest and after stimulation  Comfort (Breast/Nipple): Soft / non-tender  Hold (Positioning): Assistance needed to correctly position infant at breast and maintain latch.  LATCH Score: 8   Lactation Tools Discussed/Used    Interventions Interventions: Skin to skin;Assisted with latch;Breast compression;Adjust position;Support pillows;Position options;Expressed milk;Education;Guidelines for Milk Supply and Pumping Schedule Handout;CDC Guidelines for Breast Pump Cleaning  Discharge    Consult Status Consult Status: Follow-up Date: 11/10/23 Follow-up type: In-patient    Frederico Hamman 11/09/2023, 8:25 PM

## 2023-11-09 NOTE — Progress Notes (Signed)
Post Partum Day 1 Subjective: no complaints, up ad lib, voiding, and tolerating PO  Objective: Blood pressure (!) 97/54, pulse (!) 57, temperature 98 F (36.7 C), temperature source Oral, resp. rate 17, height 5\' 2"  (1.575 m), weight 104.6 kg, last menstrual period 02/09/2023, SpO2 99%, unknown if currently breastfeeding.  Physical Exam:  General: alert Lochia: appropriate Uterine Fundus: firm Incision: N/A DVT Evaluation: No evidence of DVT seen on physical exam.  Recent Labs    11/08/23 0109 11/09/23 0545  HGB 10.3* 9.1*  HCT 30.9* 27.8*    Assessment/Plan: Plan for discharge tomorrow and Circumcision prior to discharge   LOS: 1 day   Ranae Pila, MD 11/09/2023, 9:09 AM

## 2023-11-10 NOTE — Discharge Summary (Signed)
Postpartum Discharge Summary  Date of Service updated 11/10/23     Patient Name: Anne Anderson DOB: 1991-05-12 MRN: [id number]  Date of admission: 11/08/2023 Delivery date:11/08/2023 Delivering provider: Damaris Hippo A Date of discharge: 11/10/2023  Admitting diagnosis: Pregnancy [Z34.90] Intrauterine pregnancy: [redacted]w[redacted]d     Secondary diagnosis:  Principal Problem:   Pregnancy  Additional problems: none    Discharge diagnosis: Term Pregnancy Delivered                                              Post partum procedures: none Augmentation: AROM and Cytotec Complications: None  Hospital course: Induction of Labor With Vaginal Delivery   32 y.o. yo G2P1011 at [redacted]w[redacted]d was admitted to the hospital 11/08/2023 for induction of labor.  Indication for induction:  fibroids .  Patient had an labor course complicated bynone Membrane Rupture Time/Date: 5:24 PM,11/08/2023  Delivery Method:Vaginal, Spontaneous Operative Delivery:N/A Episiotomy: Median Lacerations:  2nd degree Details of delivery can be found in separate delivery note.  Patient had a postpartum course complicated bynone. Patient is discharged home 11/10/23.  Newborn Data: Birth date:11/08/2023 Birth time:8:09 PM Gender:Female Living status:Living Apgars:8 ,9  Weight:3110 g  Magnesium Sulfate received: No BMZ received: No Rhophylac:N/A MMR:N/A T-DaP:Given prenatally Flu: N/A Immunizations administered: There is no immunization history for the selected administration types on file for this patient.  Physical exam  Vitals:   11/09/23 1108 11/09/23 1300 11/09/23 2037 11/10/23 0605  BP: 129/84 115/75 112/69 100/60  Pulse: (!) 113 85 72 67  Resp: 18  17 18   Temp: 98.2 F (36.8 C) 98 F (36.7 C) 98.2 F (36.8 C) 98 F (36.7 C)  TempSrc: Oral Oral Oral Oral  SpO2: 100% 98% 100%   Weight:      Height:       General: alert, cooperative, and no distress Lochia: appropriate Uterine Fundus: firm Incision:  N/A DVT Evaluation: No evidence of DVT seen on physical exam. No cords or calf tenderness. No significant calf/ankle edema. Labs: Lab Results  Component Value Date   WBC 14.2 (H) 11/09/2023   HGB 9.1 (L) 11/09/2023   HCT 27.8 (L) 11/09/2023   MCV 83.7 11/09/2023   PLT 206 11/09/2023       No data to display         Edinburgh Score:    11/09/2023   11:19 PM  Edinburgh Postnatal Depression Scale Screening Tool  I have been able to laugh and see the funny side of things. 0  I have looked forward with enjoyment to things. 0  I have blamed myself unnecessarily when things went wrong. 0  I have been anxious or worried for no good reason. 0  I have felt scared or panicky for no good reason. 0  Things have been getting on top of me. 0  I have been so unhappy that I have had difficulty sleeping. 0  I have felt sad or miserable. 0  I have been so unhappy that I have been crying. 0  The thought of harming myself has occurred to me. 0  Edinburgh Postnatal Depression Scale Total 0      After visit meds:  Allergies as of 11/10/2023       Reactions   Penicillins         Medication List     TAKE these medications  IRON SUPPLEMENT PO Take by mouth.   prenatal multivitamin Tabs tablet Take 1 tablet by mouth daily at 12 noon.         Discharge home in stable condition Infant Feeding: Bottle and Breast Infant Disposition:home with mother Discharge instruction: per After Visit Summary and Postpartum booklet. Activity: Advance as tolerated. Pelvic rest for 6 weeks.  Diet: routine diet Anticipated Birth Control: Unsure Postpartum Appointment:6 weeks Additional Postpartum F/U:  none Future Appointments:No future appointments. Follow up Visit:      11/10/2023 Ranae Pila, MD

## 2023-11-13 ENCOUNTER — Encounter: Payer: Self-pay | Admitting: Obstetrics and Gynecology

## 2023-11-16 ENCOUNTER — Telehealth (HOSPITAL_COMMUNITY): Payer: Self-pay | Admitting: *Deleted

## 2023-11-16 NOTE — Telephone Encounter (Signed)
11/16/2023  Name: Anne Anderson MRN: [id number] DOB: 1991/06/01  Reason for Call:  Transition of Care Hospital Discharge Call  Contact Status: Patient Contact Status: Complete  Language assistant needed:          Follow-Up Questions: Do You Have Any Concerns About Your Health As You Heal From Delivery?: Yes What Concerns Do You Have About Your Health?: Patient asked about the healing process for her perineal stitches. Reported experiencing some discomfort. Was discharged with only a peribottle for care, per her report. RN instructed patient to call her OB office to aske about Dermoplast spray and a sitz bath for additional relief. Patient verbalized understanding. No other questions or concerns voiced. Do You Have Any Concerns About Your Infants Health?: No  Edinburgh Postnatal Depression Scale:  In the Past 7 Days: I have been able to laugh and see the funny side of things.: As much as I always could I have looked forward with enjoyment to things.: As much as I ever did I have blamed myself unnecessarily when things went wrong.: No, never I have been anxious or worried for no good reason.: No, not at all I have felt scared or panicky for no good reason.: No, not at all Things have been getting on top of me.: No, I have been coping as well as ever I have been so unhappy that I have had difficulty sleeping.: Not at all I have felt sad or miserable.: No, not at all I have been so unhappy that I have been crying.: No, never The thought of harming myself has occurred to me.: Never Inocente Salles Postnatal Depression Scale Total: 0  PHQ2-9 Depression Scale:     Discharge Follow-up: Edinburgh score requires follow up?: No Patient was advised of the following resources:: Breastfeeding Support Group, Support Group  Post-discharge interventions: Reviewed Newborn Safe Sleep Practices  Signature Deforest Hoyles, RN, 11/16/23, [phone]

## 2023-11-21 ENCOUNTER — Inpatient Hospital Stay (HOSPITAL_COMMUNITY)
Admission: AD | Admit: 2023-11-21 | Discharge: 2023-11-21 | Disposition: A | Discharge status: Home / Self Care | Payer: BC Managed Care – PPO | Attending: Obstetrics and Gynecology | Admitting: Obstetrics and Gynecology

## 2023-11-21 ENCOUNTER — Inpatient Hospital Stay (HOSPITAL_COMMUNITY): Payer: BC Managed Care – PPO

## 2023-11-21 DIAGNOSIS — R102 Pelvic and perineal pain: Secondary | ICD-10-CM | POA: Insufficient documentation

## 2023-11-21 DIAGNOSIS — D259 Leiomyoma of uterus, unspecified: Secondary | ICD-10-CM | POA: Diagnosis not present

## 2023-11-21 DIAGNOSIS — N858 Other specified noninflammatory disorders of uterus: Secondary | ICD-10-CM | POA: Diagnosis not present

## 2023-11-21 DIAGNOSIS — R195 Other fecal abnormalities: Secondary | ICD-10-CM

## 2023-11-21 DIAGNOSIS — R109 Unspecified abdominal pain: Secondary | ICD-10-CM | POA: Diagnosis not present

## 2023-11-21 LAB — COMPREHENSIVE METABOLIC PANEL
ALT: 13 U/L (ref 0–44)
AST: 19 U/L (ref 15–41)
Albumin: 3 g/dL — ABNORMAL LOW (ref 3.5–5.0)
Alkaline Phosphatase: 91 U/L (ref 38–126)
Anion gap: 10 (ref 5–15)
BUN: 16 mg/dL (ref 6–20)
CO2: 24 mmol/L (ref 22–32)
Calcium: 9.1 mg/dL (ref 8.9–10.3)
Chloride: 102 mmol/L (ref 98–111)
Creatinine, Ser: 0.65 mg/dL (ref 0.44–1.00)
GFR, Estimated: >60 mL/min (ref >60)
Glucose, Bld: 100 mg/dL — ABNORMAL HIGH (ref 70–99)
Potassium: 3.4 mmol/L — ABNORMAL LOW (ref 3.5–5.1)
Sodium: 136 mmol/L (ref 135–145)
Total Bilirubin: 0.2 mg/dL (ref <1.2)
Total Protein: 8.1 g/dL (ref 6.5–8.1)

## 2023-11-21 LAB — URINALYSIS, ROUTINE W REFLEX MICROSCOPIC
Bacteria, UA: NONE SEEN
Bilirubin Urine: NEGATIVE
Glucose, UA: NEGATIVE mg/dL
Ketones, ur: NEGATIVE mg/dL
Leukocytes,Ua: NEGATIVE
Nitrite: NEGATIVE
Protein, ur: 100 mg/dL — AB
RBC / HPF: >50 RBC/hpf (ref 0–5)
Specific Gravity, Urine: >1.046 — ABNORMAL HIGH (ref 1.005–1.030)
pH: 6 (ref 5.0–8.0)

## 2023-11-21 LAB — CBC WITH DIFFERENTIAL/PLATELET
Abs Immature Granulocytes: 0.03 10*3/uL (ref 0.00–0.07)
Basophils Absolute: 0 10*3/uL (ref 0.0–0.1)
Basophils Relative: 0 %
Eosinophils Absolute: 0.1 10*3/uL (ref 0.0–0.5)
Eosinophils Relative: 1 %
HCT: 37.1 % (ref 36.0–46.0)
Hemoglobin: 11.7 g/dL — ABNORMAL LOW (ref 12.0–15.0)
Immature Granulocytes: 0 %
Lymphocytes Relative: 22 %
Lymphs Abs: 2.1 10*3/uL (ref 0.7–4.0)
MCH: 26.8 pg (ref 26.0–34.0)
MCHC: 31.5 g/dL (ref 30.0–36.0)
MCV: 85.1 fL (ref 80.0–100.0)
Monocytes Absolute: 0.5 10*3/uL (ref 0.1–1.0)
Monocytes Relative: 5 %
Neutro Abs: 6.6 10*3/uL (ref 1.7–7.7)
Neutrophils Relative %: 72 %
Platelets: 365 10*3/uL (ref 150–400)
RBC: 4.36 MIL/uL (ref 3.87–5.11)
RDW: 15 % (ref 11.5–15.5)
WBC: 9.3 10*3/uL (ref 4.0–10.5)
nRBC: 0 % (ref 0.0–0.2)

## 2023-11-21 LAB — LACTIC ACID, PLASMA: Lactic Acid, Venous: 1.4 mmol/L (ref 0.5–1.9)

## 2023-11-21 LAB — LIPASE, BLOOD: Lipase: 18 U/L (ref 11–51)

## 2023-11-21 MED ORDER — DERMOPLAST 20-0.5 % EX AERO: 1.0000 | INHALATION_SPRAY | Freq: Four times a day (QID) | CUTANEOUS | 1 refills | Status: AC | PRN

## 2023-11-21 MED ORDER — IBUPROFEN 600 MG PO TABS: 600.0000 mg | ORAL_TABLET | Freq: Four times a day (QID) | ORAL | 0 refills | Status: AC | PRN

## 2023-11-21 MED ORDER — WITCH HAZEL-GLYCERIN EX PADS: 1.0000 | MEDICATED_PAD | CUTANEOUS | 12 refills | Status: AC | PRN

## 2023-11-21 MED ORDER — KETOROLAC TROMETHAMINE 30 MG/ML IJ SOLN
30.0000 mg | Freq: Once | INTRAMUSCULAR | Status: AC
  Administered 2023-11-21: 30 mg via INTRAMUSCULAR
  Filled 2023-11-21: qty 1

## 2023-11-21 MED ORDER — IOHEXOL 350 MG/ML SOLN
75.0000 mL | Freq: Once | INTRAVENOUS | Status: AC | PRN
  Administered 2023-11-21: 75 mL via INTRAVENOUS

## 2023-11-21 NOTE — Discharge Instructions (Signed)
[facility]  Tuesdays & Thursdays 9:30am-3:30pm [address], [place], [place] Located in the [employer] [facility] ([facility] Room 110A  For more information: Call: [phone] or Email: [email or web address] To book a FREE appt: [email or web address]  Offering prenatal consults,  postpartum outpatient lactation consults,  pumping consults and  "Each One, Teach One" informal lactation education for support persons.

## 2023-11-21 NOTE — MAU Provider Note (Addendum)
History     CSN: [id number]  Arrival date and time: 11/21/23 1502   None     Chief Complaint  Patient presents with   Abdominal Pain   .Anne Anderson is a 32 y.o. postpartum following vaginal delivery MAU reporting: Right sided abdominal pain radiates down and across her lower abdomen. Rates pain 10/10. She reports she has had trouble sleeping and laying on her right side. States that pain started at discharge from hospital 11/23. Reports appropriate bleeding.  Reports difficulty with stooling. Has been taking miralax. One large, hard BM and now followed by watery stool. Reports she is pumping and feeding this milk to baby. Voiding well, reports burning due to stitches. Last took 1000mg  tylenol yesterday without relief.      Abdominal Pain Associated symptoms include constipation. Pertinent negatives include no arthralgias, diarrhea, fever, frequency, myalgias, nausea or vomiting.    OB History     Gravida  2   Para  1   Term  1   Preterm      AB  1   Living  1      SAB  1   IAB      Ectopic      Multiple  0   Live Births  1           Past Medical History:  Diagnosis Date   Anemia    Fibroid    Vaginal Pap smear, abnormal     No past surgical history on file.  Family History  Problem Relation Age of Onset   Hyperlipidemia Mother    Cancer Maternal Grandmother    Diabetes Maternal Grandfather     Social History   Tobacco Use   Smoking status: Never   Smokeless tobacco: Never  Vaping Use   Vaping status: Never Used  Substance Use Topics   Alcohol use: Not Currently   Drug use: Not Currently    Allergies:  Allergies  Allergen Reactions   Penicillins     Medications Prior to Admission  Medication Sig Dispense Refill Last Dose   acetaminophen (TYLENOL) 500 MG tablet Take 500 mg by mouth every 6 (six) hours as needed.   Past Week   polyethylene glycol powder (GLYCOLAX/MIRALAX) 17 GM/SCOOP powder Take 1 Container by mouth once.    11/21/2023   Ferrous Sulfate (IRON SUPPLEMENT PO) Take by mouth.      Prenatal Vit-Fe Fumarate-FA (PRENATAL MULTIVITAMIN) TABS tablet Take 1 tablet by mouth daily at 12 noon.       Review of Systems  Constitutional:  Negative for chills, fatigue, fever and unexpected weight change.  Respiratory:  Negative for cough and shortness of breath.   Cardiovascular:  Negative for chest pain and palpitations.  Gastrointestinal:  Positive for abdominal pain and constipation. Negative for diarrhea, nausea and vomiting.  Genitourinary:  Negative for difficulty urinating, flank pain, frequency and urgency.  Musculoskeletal:  Negative for arthralgias and myalgias.   Physical Exam   Blood pressure 106/74, pulse (!) 102, temperature 98.5 F (36.9 C), temperature source Oral, resp. rate 16, SpO2 97%, currently breastfeeding.  Physical Exam Vitals reviewed.  Constitutional:      Appearance: Normal appearance.  HENT:     Head: Normocephalic.  Cardiovascular:     Rate and Rhythm: Normal rate.     Pulses: Normal pulses.  Pulmonary:     Effort: Pulmonary effort is normal.  Abdominal:     General: Abdomen is protuberant.     Tenderness: There is  generalized abdominal tenderness and tenderness in the right lower quadrant and periumbilical area. There is no right CVA tenderness, left CVA tenderness or rebound.     Hernia: No hernia is present.  Skin:    General: Skin is warm and dry.     Capillary Refill: Capillary refill takes less than 2 seconds.  Neurological:     Mental Status: She is alert and oriented to person, place, and time.  Psychiatric:        Mood and Affect: Mood normal.        Behavior: Behavior normal.        Thought Content: Thought content normal.        Judgment: Judgment normal.      MAU Course   Results for orders placed or performed during the hospital encounter of 11/21/23 (from the past 24 hour(s))  CBC with Differential/Platelet     Status: Abnormal   Collection Time:  11/21/23  4:33 PM  Result Value Ref Range   WBC 9.3 4.0 - 10.5 K/uL   RBC 4.36 3.87 - 5.11 MIL/uL   Hemoglobin 11.7 (L) 12.0 - 15.0 g/dL   HCT 16.0 10.9 - 32.3 %   MCV 85.1 80.0 - 100.0 fL   MCH 26.8 26.0 - 34.0 pg   MCHC 31.5 30.0 - 36.0 g/dL   RDW 55.7 32.2 - 02.5 %   Platelets 365 150 - 400 K/uL   nRBC 0.0 0.0 - 0.2 %   Neutrophils Relative % 72 %   Neutro Abs 6.6 1.7 - 7.7 K/uL   Lymphocytes Relative 22 %   Lymphs Abs 2.1 0.7 - 4.0 K/uL   Monocytes Relative 5 %   Monocytes Absolute 0.5 0.1 - 1.0 K/uL   Eosinophils Relative 1 %   Eosinophils Absolute 0.1 0.0 - 0.5 K/uL   Basophils Relative 0 %   Basophils Absolute 0.0 0.0 - 0.1 K/uL   Immature Granulocytes 0 %   Abs Immature Granulocytes 0.03 0.00 - 0.07 K/uL  Comprehensive metabolic panel     Status: Abnormal   Collection Time: 11/21/23  4:33 PM  Result Value Ref Range   Sodium 136 135 - 145 mmol/L   Potassium 3.4 (L) 3.5 - 5.1 mmol/L   Chloride 102 98 - 111 mmol/L   CO2 24 22 - 32 mmol/L   Glucose, Bld 100 (H) 70 - 99 mg/dL   BUN 16 6 - 20 mg/dL   Creatinine, Ser 4.27 0.44 - 1.00 mg/dL   Calcium 9.1 8.9 - 06.2 mg/dL   Total Protein 8.1 6.5 - 8.1 g/dL   Albumin 3.0 (L) 3.5 - 5.0 g/dL   AST 19 15 - 41 U/L   ALT 13 0 - 44 U/L   Alkaline Phosphatase 91 38 - 126 U/L   Total Bilirubin 0.2 <1.2 mg/dL   GFR, Estimated >37 >62 mL/min   Anion gap 10 5 - 15  Lipase, blood     Status: None   Collection Time: 11/21/23  4:33 PM  Result Value Ref Range   Lipase 18 11 - 51 U/L  Lactic acid, plasma     Status: None   Collection Time: 11/21/23  4:39 PM  Result Value Ref Range   Lactic Acid, Venous 1.4 0.5 - 1.9 mmol/L   No results found.  MDM PE Labs: Lactic acid, lipase, CBC, CMP, CT  EFM  Assessment and Plan  32yo  G2 P1011  Postpartum, vaginal delivery severe abdominal pain.    Huntley Dec  A [place] MSN, CNM 11/21/2023, 7:48 PM   Handoff of care to Wynelle Bourgeois, CNM at [phone] 11/21/23.   Assumed care CT scan  negative except for multiple fibroids  CT ABDOMEN PELVIS W CONTRAST  Result Date: 11/21/2023 CLINICAL DATA:  Abdominal pain. EXAM: CT ABDOMEN AND PELVIS WITH CONTRAST TECHNIQUE: Multidetector CT imaging of the abdomen and pelvis was performed using the standard protocol following bolus administration of intravenous contrast. RADIATION DOSE REDUCTION: This exam was performed according to the departmental dose-optimization program which includes automated exposure control, adjustment of the mA and/or kV according to patient size and/or use of iterative reconstruction technique. CONTRAST:  75mL OMNIPAQUE IOHEXOL 350 MG/ML SOLN COMPARISON:  None Available. FINDINGS: Lower chest: No acute abnormality. Hepatobiliary: No focal liver abnormality is seen. No gallstones, gallbladder wall thickening, or biliary dilatation. Pancreas: Unremarkable. No pancreatic ductal dilatation or surrounding inflammatory changes. Spleen: Normal in size without focal abnormality. Adrenals/Urinary Tract: Adrenal glands are unremarkable. Kidneys are normal, without renal calculi, focal lesion, or hydronephrosis. Bladder is unremarkable. Stomach/Bowel: Stomach is within normal limits. The appendix is not identified. No evidence of bowel wall thickening, distention, or inflammatory changes. Vascular/Lymphatic: No significant vascular findings are present. No enlarged abdominal or pelvic lymph nodes. Reproductive: A 13.4 cm x 22.2 cm x 13.5 cm mildly heterogeneous low-attenuation (approximately 29.45 Hounsfield units) soft tissue mass is seen extending from the anterolateral aspect of the uterine fundus on the right. This extends into the anterior aspect of the mid abdomen at the level of the kidneys. Additional low-attenuation (approximately 38.29 Hounsfield unit) uterine masses are noted. The largest measures approximately 11.4 cm x 8.9 cm x 7.2 cm. The bilateral adnexa are limited in evaluation. Other: No abdominal wall hernia or  abnormality. No abdominopelvic ascites. Musculoskeletal: No acute or significant osseous findings. IMPRESSION: Findings likely consistent with multiple large uterine fibroids, as described above. MRI correlation is recommended. Electronically Signed   By: Aram Candela M.D.   On: 11/21/2023 19:57    Has not had ibuprofen to take at home Tylenol has not been working  Toradol 30mg  IM given here States pain went from a "10" to "0" WIll discharge home with rx for ibuprofen which may give more antiinflammatory effect Rx sent to pharmacy in [facility] where she is staying with her mother  A;  Pelvic pain post vaginal delivery       Uterine fibroids, ?possibly one degenerating       Post vaginal delivery, PP Day #13  P:  Discharge home       Rx ibuprofen 600mg  q6h prn       Followup with Dr Elon Spanner      Encouraged to return if she develops worsening of symptoms, increase in pain, fever, or other concerning symptoms.   Aviva Signs, CNM

## 2023-11-21 NOTE — MAU Note (Signed)
.  Anne Anderson is a 32 y.o. at Unknown here in MAU reporting: Anterior mid abdominal pain and right sided mid abdominal pain that radiates down and across her lower abdomen. Rates pain 10/10. She reports she has been unable to lay on her right side due to having shooting pain in that area.States that pain started 11/23. She reports she was soaking through normal maxi pads after delivery which resolved 3-4 days later. She reports she is now wearing period panties and changes about every 5 hours.  Meds: Tylenol the day before yesterday Miralax - today - now having watery stools   Onset of complaint: 11/10/2023 Pain score: 10/10 abdomen  Vitals:   11/21/23 1529  BP: 106/74  Pulse: (!) 102  Resp: 16  Temp: 98.5 F (36.9 C)  SpO2: 97%      Lab orders placed from triage:  none

## 2023-11-21 NOTE — MAU Note (Signed)
 CT requesting transport for patient.

## 2023-12-24 DIAGNOSIS — Z1389 Encounter for screening for other disorder: Secondary | ICD-10-CM | POA: Diagnosis not present

## 2024-01-02 DIAGNOSIS — Z789 Other specified health status: Secondary | ICD-10-CM | POA: Diagnosis not present

## 2024-01-02 DIAGNOSIS — Z111 Encounter for screening for respiratory tuberculosis: Secondary | ICD-10-CM | POA: Diagnosis not present

## 2024-01-02 DIAGNOSIS — Z0000 Encounter for general adult medical examination without abnormal findings: Secondary | ICD-10-CM | POA: Diagnosis not present

## 2024-01-02 DIAGNOSIS — D509 Iron deficiency anemia, unspecified: Secondary | ICD-10-CM | POA: Diagnosis not present

## 2024-11-25 DIAGNOSIS — Z1151 Encounter for screening for human papillomavirus (HPV): Secondary | ICD-10-CM | POA: Diagnosis not present

## 2024-11-25 DIAGNOSIS — Z6841 Body Mass Index (BMI) 40.0 and over, adult: Secondary | ICD-10-CM | POA: Diagnosis not present

## 2024-11-25 DIAGNOSIS — Z124 Encounter for screening for malignant neoplasm of cervix: Secondary | ICD-10-CM | POA: Diagnosis not present

## 2024-11-25 DIAGNOSIS — D649 Anemia, unspecified: Secondary | ICD-10-CM | POA: Diagnosis not present

## 2024-11-25 DIAGNOSIS — Z01419 Encounter for gynecological examination (general) (routine) without abnormal findings: Secondary | ICD-10-CM | POA: Diagnosis not present

## 2024-11-25 DIAGNOSIS — Z113 Encounter for screening for infections with a predominantly sexual mode of transmission: Secondary | ICD-10-CM | POA: Diagnosis not present

## 2024-12-05 ENCOUNTER — Other Ambulatory Visit (HOSPITAL_COMMUNITY): Payer: Self-pay | Admitting: Family

## 2024-12-05 DIAGNOSIS — D508 Other iron deficiency anemias: Secondary | ICD-10-CM | POA: Insufficient documentation

## 2024-12-05 DIAGNOSIS — D509 Iron deficiency anemia, unspecified: Secondary | ICD-10-CM | POA: Insufficient documentation

## 2024-12-08 ENCOUNTER — Telehealth (HOSPITAL_COMMUNITY): Payer: Self-pay | Admitting: Pharmacy Technician

## 2024-12-08 DIAGNOSIS — R112 Nausea with vomiting, unspecified: Secondary | ICD-10-CM | POA: Diagnosis not present

## 2024-12-08 DIAGNOSIS — B349 Viral infection, unspecified: Secondary | ICD-10-CM | POA: Diagnosis not present

## 2024-12-08 DIAGNOSIS — R197 Diarrhea, unspecified: Secondary | ICD-10-CM | POA: Diagnosis not present

## 2024-12-08 DIAGNOSIS — R519 Headache, unspecified: Secondary | ICD-10-CM | POA: Diagnosis not present

## 2024-12-08 DIAGNOSIS — R6883 Chills (without fever): Secondary | ICD-10-CM | POA: Diagnosis not present

## 2024-12-08 NOTE — Telephone Encounter (Signed)
 Auth Submission: NO AUTH NEEDED Site of care: [facility] Payer: [employer] Medication & CPT/J Code(s) submitted: Venofer  (Iron  Sucrose) J1756 Diagnosis Code: D50.8, D50.9 Route of submission (phone, fax, portal):  Phone # Fax # Auth type: Buy/Bill HB Units/visits requested: 300MG  X 3 DOSES Reference number: [email or web address] Approval from: 12/08/2024 to 03/08/25    Dagoberto Armour, CPhT [facility] Phone: [phone] 12/08/2024

## 2024-12-23 ENCOUNTER — Encounter (HOSPITAL_COMMUNITY)
Admission: RE | Admit: 2024-12-23 | Discharge: 2024-12-23 | Disposition: A | Discharge status: Home / Self Care | Source: Ambulatory Visit | Attending: Family | Admitting: Family

## 2024-12-23 VITALS — BP 117/75 | HR 91 | Temp 98.0°F | Resp 15

## 2024-12-23 DIAGNOSIS — D508 Other iron deficiency anemias: Secondary | ICD-10-CM | POA: Insufficient documentation

## 2024-12-23 DIAGNOSIS — D509 Iron deficiency anemia, unspecified: Secondary | ICD-10-CM

## 2024-12-23 MED ORDER — IRON SUCROSE 300 MG IVPB - SIMPLE MED
Status: AC
  Filled 2024-12-23: qty 265

## 2024-12-23 MED ORDER — IRON SUCROSE 300 MG IVPB - SIMPLE MED
300.0000 mg | Freq: Once | Status: AC
  Administered 2024-12-23: 300 mg via INTRAVENOUS

## 2024-12-30 ENCOUNTER — Encounter (HOSPITAL_COMMUNITY)
Admission: RE | Admit: 2024-12-30 | Discharge: 2024-12-30 | Disposition: A | Source: Ambulatory Visit | Attending: Family | Admitting: Family

## 2024-12-30 VITALS — BP 106/64 | HR 82 | Temp 97.7°F | Resp 15

## 2024-12-30 DIAGNOSIS — D508 Other iron deficiency anemias: Secondary | ICD-10-CM | POA: Diagnosis not present

## 2024-12-30 DIAGNOSIS — D509 Iron deficiency anemia, unspecified: Secondary | ICD-10-CM

## 2024-12-30 MED ORDER — IRON SUCROSE 300 MG IVPB - SIMPLE MED
300.0000 mg | Freq: Once | Status: AC
  Administered 2024-12-30: 300 mg via INTRAVENOUS

## 2024-12-30 MED ORDER — IRON SUCROSE 300 MG IVPB - SIMPLE MED
Status: AC
  Filled 2024-12-30: qty 265

## 2025-01-06 ENCOUNTER — Encounter (HOSPITAL_COMMUNITY)
Admission: RE | Admit: 2025-01-06 | Discharge: 2025-01-06 | Disposition: A | Source: Ambulatory Visit | Attending: Family | Admitting: Family

## 2025-01-06 VITALS — BP 113/75 | HR 77 | Temp 97.8°F | Resp 15

## 2025-01-06 DIAGNOSIS — D508 Other iron deficiency anemias: Secondary | ICD-10-CM | POA: Diagnosis not present

## 2025-01-06 DIAGNOSIS — D509 Iron deficiency anemia, unspecified: Secondary | ICD-10-CM

## 2025-01-06 MED ORDER — IRON SUCROSE 300 MG IVPB - SIMPLE MED
Status: AC
  Filled 2025-01-06: qty 265

## 2025-01-06 MED ORDER — IRON SUCROSE 300 MG IVPB - SIMPLE MED
300.0000 mg | Freq: Once | Status: AC
  Administered 2025-01-06: 300 mg via INTRAVENOUS
